# Patient Record
Sex: Female | Born: 1945 | Race: White | Hispanic: No | Marital: Married | Smoking: Never smoker
Health system: Southern US, Community
[De-identification: ages and names within clinical notes are randomized; demographics above are authoritative.]

## PROBLEM LIST (undated history)

## (undated) DIAGNOSIS — Z972 Presence of dental prosthetic device (complete) (partial): Secondary | ICD-10-CM

## (undated) DIAGNOSIS — K219 Gastro-esophageal reflux disease without esophagitis: Secondary | ICD-10-CM

## (undated) DIAGNOSIS — G2581 Restless legs syndrome: Secondary | ICD-10-CM

## (undated) HISTORY — PX: EYE SURGERY: SHX253

---

## 1984-02-10 HISTORY — PX: ABDOMINAL HYSTERECTOMY: SHX81

## 2004-03-12 ENCOUNTER — Inpatient Hospital Stay: Payer: Self-pay | Admitting: Internal Medicine

## 2004-03-13 ENCOUNTER — Other Ambulatory Visit: Payer: Self-pay

## 2004-07-09 ENCOUNTER — Other Ambulatory Visit: Payer: Self-pay

## 2004-07-09 ENCOUNTER — Inpatient Hospital Stay: Payer: Self-pay | Admitting: Internal Medicine

## 2005-07-02 ENCOUNTER — Ambulatory Visit: Payer: Self-pay | Admitting: Internal Medicine

## 2006-03-11 ENCOUNTER — Ambulatory Visit: Payer: Self-pay | Admitting: Internal Medicine

## 2006-04-29 ENCOUNTER — Ambulatory Visit: Payer: Self-pay | Admitting: Gastroenterology

## 2007-02-10 HISTORY — PX: ROTATOR CUFF REPAIR: SHX139

## 2007-03-24 ENCOUNTER — Ambulatory Visit: Payer: Self-pay | Admitting: Nurse Practitioner

## 2007-12-28 ENCOUNTER — Ambulatory Visit: Payer: Self-pay | Admitting: Unknown Physician Specialty

## 2008-01-03 ENCOUNTER — Ambulatory Visit: Payer: Self-pay | Admitting: Unknown Physician Specialty

## 2008-10-25 ENCOUNTER — Ambulatory Visit: Payer: Self-pay | Admitting: Nurse Practitioner

## 2010-01-06 ENCOUNTER — Ambulatory Visit: Payer: Self-pay | Admitting: Family Medicine

## 2011-08-25 ENCOUNTER — Ambulatory Visit: Payer: Self-pay

## 2013-01-19 ENCOUNTER — Ambulatory Visit: Payer: Self-pay

## 2013-02-09 HISTORY — PX: RETINAL DETACHMENT SURGERY: SHX105

## 2013-04-24 ENCOUNTER — Ambulatory Visit: Payer: Self-pay | Admitting: Ophthalmology

## 2013-05-03 ENCOUNTER — Ambulatory Visit: Payer: Self-pay | Admitting: Ophthalmology

## 2014-02-21 ENCOUNTER — Ambulatory Visit: Payer: Self-pay

## 2014-06-02 NOTE — Op Note (Signed)
PATIENT NAME:  Alexandra Farmer, Alexandra Farmer MR#:  [id number] DATE OF BIRTH:  01-01-1946  DATE OF PROCEDURE:  05/03/2013  PROCEDURES PERFORMED:  1. Pars plana vitrectomy of the right eye.  2. Internal limiting membrane peel of the right eye.   PREOPERATIVE DIAGNOSIS: Macular hole.   POSTOPERATIVE DIAGNOSIS: Macular hole.   ANESTHESIA: Retrobulbar block of the right eye with monitored anesthesia care.   COMPLICATIONS: None.   ESTIMATED BLOOD LOSS: Less than 1 mL.  PRIMARY SURGEON: Ignacia FellingMatthew F. Zerina Hallinan, MD  INDICATIONS FOR PROCEDURE: The patient presented to my office with decreased vision in her right eye. 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 Risks, benefits and alternatives of the above procedure were discussed, and the patient wished to proceed.   DETAILS OF PROCEDURE: After informed consent was obtained, the patient was brought into the operative suite at [facility]. The patient was placed in supine position, was given a small dose of Alfenta, and a retrobulbar block was performed in the right eye by the primary surgeon without any complications. The right eye was prepped and draped in sterile manner. After lid speculum was inserted, a 25-gauge trocar was placed inferotemporally through displaced conjunctiva in an oblique fashion 4 mm beyond the limbus. Infusion cannula was turned on and inserted through the trocar and secured in position with Steri-Strips. Two more trocars were placed in a similar fashion superotemporally and superonasally. Vitreous cutter and light pipe were introduced in the eye, and a core vitrectomy was performed. The vitreous face was elevated using suction without complication. The vitreous base was trimmed away. The peripheral vitreous was trimmed for 360 degrees, with care taken to avoid hitting the crystalline lens. Indocyanine green was injected onto the posterior pole and removed within 30 seconds. An internal limiting membrane  peel was performed for 360 degrees around the fovea for a total diameter of 2 disk diameters without complication. Scleral depressed exam was performed for 360 degrees, and no signs of any breaks, tears or retinal detachment could be identified. A complete air-fluid exchange was performed. Four minutes was allowed to elapse for dehydration of the vitreous base. This remnant fluid was removed. The 22% SF6 was used as an Systems developerair-gas exchange, and all the trocars were removed. The superonasal wound was closed using transconjunctival 6-0 plain gut. Pressure in the eye was confirmed to be approximately 15 mmHg. Dexamethasone 5 mg was given into the inferior fornix. The lid speculum was removed, and the eye was cleaned. TobraDex was placed in the eye, and a patch and shield were placed on the eye. The patient was taken to postanesthesia care with instruction to remain face down.   ____________________________ Ignacia FellingMatthew F. Champ MungoAppenzeller, MD mfa:lb D: 05/03/2013 09:23:49 ET T: 05/03/2013 09:36:21 ET JOB#: [id number]  cc: Ignacia FellingMatthew F. Champ MungoAppenzeller, MD, <Dictator> Cline CoolsMATTHEW F Joanthony Hamza MD ELECTRONICALLY SIGNED 05/17/2013 8:53

## 2014-12-05 ENCOUNTER — Encounter: Payer: Self-pay | Admitting: *Deleted

## 2014-12-06 NOTE — Discharge Instructions (Signed)

## 2014-12-10 ENCOUNTER — Ambulatory Visit
Admission: RE | Admit: 2014-12-10 | Discharge: 2014-12-10 | Disposition: A | Discharge status: Home / Self Care | Payer: Medicare PPO | Source: Ambulatory Visit | Attending: Ophthalmology | Admitting: Ophthalmology

## 2014-12-10 ENCOUNTER — Ambulatory Visit: Payer: Medicare PPO | Admitting: Anesthesiology

## 2014-12-10 ENCOUNTER — Encounter
Admission: RE | Disposition: A | Discharge status: Home / Self Care | Payer: Self-pay | Source: Ambulatory Visit | Attending: Ophthalmology

## 2014-12-10 DIAGNOSIS — Z882 Allergy status to sulfonamides status: Secondary | ICD-10-CM | POA: Insufficient documentation

## 2014-12-10 DIAGNOSIS — H2511 Age-related nuclear cataract, right eye: Secondary | ICD-10-CM | POA: Diagnosis present

## 2014-12-10 DIAGNOSIS — R32 Unspecified urinary incontinence: Secondary | ICD-10-CM | POA: Insufficient documentation

## 2014-12-10 DIAGNOSIS — Z90710 Acquired absence of both cervix and uterus: Secondary | ICD-10-CM | POA: Insufficient documentation

## 2014-12-10 DIAGNOSIS — K219 Gastro-esophageal reflux disease without esophagitis: Secondary | ICD-10-CM | POA: Insufficient documentation

## 2014-12-10 DIAGNOSIS — G2581 Restless legs syndrome: Secondary | ICD-10-CM | POA: Diagnosis not present

## 2014-12-10 HISTORY — PX: CATARACT EXTRACTION W/PHACO: SHX586

## 2014-12-10 HISTORY — DX: Gastro-esophageal reflux disease without esophagitis: K21.9

## 2014-12-10 HISTORY — DX: Restless legs syndrome: G25.81

## 2014-12-10 HISTORY — DX: Presence of dental prosthetic device (complete) (partial): Z97.2

## 2014-12-10 SURGERY — PHACOEMULSIFICATION, CATARACT, WITH IOL INSERTION
Anesthesia: Monitor Anesthesia Care | Site: Eye | Laterality: Right | Wound class: Clean

## 2014-12-10 MED ORDER — LACTATED RINGERS IV SOLN: INTRAVENOUS | Status: DC

## 2014-12-10 MED ORDER — LIDOCAINE HCL (PF) 4 % IJ SOLN
INTRAOCULAR | Status: DC | PRN
  Administered 2014-12-10: 1 mL via OPHTHALMIC

## 2014-12-10 MED ORDER — TIMOLOL MALEATE 0.5 % OP SOLN
OPHTHALMIC | Status: DC | PRN
  Administered 2014-12-10: 1 [drp] via OPHTHALMIC

## 2014-12-10 MED ORDER — FENTANYL CITRATE (PF) 100 MCG/2ML IJ SOLN
INTRAMUSCULAR | Status: DC | PRN
  Administered 2014-12-10: 50 ug via INTRAVENOUS

## 2014-12-10 MED ORDER — MIDAZOLAM HCL 2 MG/2ML IJ SOLN
INTRAMUSCULAR | Status: DC | PRN
  Administered 2014-12-10: 2 mg via INTRAVENOUS

## 2014-12-10 MED ORDER — BRIMONIDINE TARTRATE 0.2 % OP SOLN
OPHTHALMIC | Status: DC | PRN
  Administered 2014-12-10: 1 [drp] via OPHTHALMIC

## 2014-12-10 MED ORDER — POVIDONE-IODINE 5 % OP SOLN
1.0000 "application " | OPHTHALMIC | Status: DC | PRN
  Administered 2014-12-10: 1 via OPHTHALMIC

## 2014-12-10 MED ORDER — NA HYALUR & NA CHOND-NA HYALUR 0.4-0.35 ML IO KIT
PACK | INTRAOCULAR | Status: DC | PRN
  Administered 2014-12-10: 1 mL via INTRAOCULAR

## 2014-12-10 MED ORDER — TETRACAINE HCL 0.5 % OP SOLN
1.0000 [drp] | OPHTHALMIC | Status: DC | PRN
  Administered 2014-12-10: 1 [drp] via OPHTHALMIC

## 2014-12-10 MED ORDER — EPINEPHRINE HCL 1 MG/ML IJ SOLN
INTRAOCULAR | Status: DC | PRN
  Administered 2014-12-10: 94 mL via OPHTHALMIC

## 2014-12-10 MED ORDER — CEFUROXIME OPHTHALMIC INJECTION 1 MG/0.1 ML
INJECTION | OPHTHALMIC | Status: DC | PRN
  Administered 2014-12-10: 0.1 mL via INTRACAMERAL

## 2014-12-10 MED ORDER — ARMC OPHTHALMIC DILATING GEL
1.0000 "application " | OPHTHALMIC | Status: DC | PRN
  Administered 2014-12-10 (×2): 1 via OPHTHALMIC

## 2014-12-10 SURGICAL SUPPLY — 29 items
APPLICATOR COTTON TIP 3IN (MISCELLANEOUS) ×3 IMPLANT
CANNULA ANT/CHMB 27GA (MISCELLANEOUS) ×3 IMPLANT
DISSECTOR HYDRO NUCLEUS 50X22 (MISCELLANEOUS) ×3 IMPLANT
GLOVE BIO SURGEON STRL SZ7 (GLOVE) ×3 IMPLANT
GLOVE SURG LX 6.5 MICRO (GLOVE) ×2
GLOVE SURG LX STRL 6.5 MICRO (GLOVE) ×1 IMPLANT
GOWN STRL REUS W/ TWL LRG LVL3 (GOWN DISPOSABLE) ×2 IMPLANT
GOWN STRL REUS W/TWL LRG LVL3 (GOWN DISPOSABLE) ×4
LENS IOL ACRSF IQ PC 20.5 (Intraocular Lens) ×1 IMPLANT
LENS IOL ACRYSOF IQ POST 20.5 (Intraocular Lens) ×3 IMPLANT
MARKER SKIN SURG W/RULER VIO (MISCELLANEOUS) ×3 IMPLANT
NEEDLE FILTER BLUNT 18X 1/2SAF (NEEDLE) ×4
NEEDLE FILTER BLUNT 18X1 1/2 (NEEDLE) ×2 IMPLANT
PACK CATARACT BRASINGTON (MISCELLANEOUS) ×3 IMPLANT
PACK EYE AFTER SURG (MISCELLANEOUS) ×3 IMPLANT
PACK OPTHALMIC (MISCELLANEOUS) ×3 IMPLANT
RING MALYGIN 7.0 (MISCELLANEOUS) IMPLANT
SOL BAL SALT 15ML (MISCELLANEOUS)
SOLUTION BAL SALT 15ML (MISCELLANEOUS) IMPLANT
SUT ETHILON 10-0 CS-B-6CS-B-6 (SUTURE)
SUT VICRYL  9 0 (SUTURE)
SUT VICRYL 9 0 (SUTURE) IMPLANT
SUTURE EHLN 10-0 CS-B-6CS-B-6 (SUTURE) IMPLANT
SYR 3ML LL SCALE MARK (SYRINGE) ×6 IMPLANT
SYR TB 1ML LUER SLIP (SYRINGE) ×3 IMPLANT
WATER STERILE IRR 250ML POUR (IV SOLUTION) ×3 IMPLANT
WATER STERILE IRR 500ML POUR (IV SOLUTION) IMPLANT
WICK EYE OCUCEL (MISCELLANEOUS) IMPLANT
WIPE NON LINTING 3.25X3.25 (MISCELLANEOUS) ×3 IMPLANT

## 2014-12-10 NOTE — Anesthesia Postprocedure Evaluation (Signed)
  Anesthesia Post-op Note  Patient: Alexandra Farmer  Procedure(s) Performed: Procedure(s): CATARACT EXTRACTION PHACO AND INTRAOCULAR LENS PLACEMENT (IOC) (Right)  Anesthesia type:MAC  Patient location: PACU  Post pain: Pain level controlled  Post assessment: Post-op Vital signs reviewed, Patient's Cardiovascular Status Stable, Respiratory Function Stable, Patent Airway and No signs of Nausea or vomiting  Post vital signs: Reviewed and stable  Last Vitals:  Filed Vitals:   12/10/14 1224  BP:   Pulse: 67  Temp: 36.5 C  Resp: 14    Level of consciousness: awake, alert  and patient cooperative  Complications: No apparent anesthesia complications

## 2014-12-10 NOTE — Anesthesia Preprocedure Evaluation (Signed)
Anesthesia Evaluation  Patient identified by MRN, date of birth, ID band Patient awake    Reviewed: Allergy & Precautions, H&P , NPO status , Patient's Chart, lab work & pertinent test results  Airway Mallampati: II  TM Distance: >3 FB Neck ROM: full    Dental no notable dental hx.    Pulmonary neg pulmonary ROS,    Pulmonary exam normal        Cardiovascular negative cardio ROS Normal cardiovascular exam     Neuro/Psych    GI/Hepatic Neg liver ROS, GERD  Medicated,  Endo/Other  negative endocrine ROS  Renal/GU negative Renal ROS     Musculoskeletal   Abdominal   Peds  Hematology negative hematology ROS (+)   Anesthesia Other Findings   Reproductive/Obstetrics                             Anesthesia Physical Anesthesia Plan  ASA: II  Anesthesia Plan: MAC   Post-op Pain Management:    Induction:   Airway Management Planned:   Additional Equipment:   Intra-op Plan:   Post-operative Plan:   Informed Consent: I have reviewed the patients History and Physical, chart, labs and discussed the procedure including the risks, benefits and alternatives for the proposed anesthesia with the patient or authorized representative who has indicated his/her understanding and acceptance.     Plan Discussed with: CRNA  Anesthesia Plan Comments:         Anesthesia Quick Evaluation

## 2014-12-10 NOTE — Transfer of Care (Signed)
Immediate Anesthesia Transfer of Care Note  Patient: Alexandra MaxwellMary S Weick  Procedure(s) Performed: Procedure(s): CATARACT EXTRACTION PHACO AND INTRAOCULAR LENS PLACEMENT (IOC) (Right)  Patient Location: PACU  Anesthesia Type: MAC  Level of Consciousness: awake, alert  and patient cooperative  Airway and Oxygen Therapy: Patient Spontanous Breathing and Patient connected to supplemental oxygen  Post-op Assessment: Post-op Vital signs reviewed, Patient's Cardiovascular Status Stable, Respiratory Function Stable, Patent Airway and No signs of Nausea or vomiting  Post-op Vital Signs: Reviewed and stable  Complications: No apparent anesthesia complications

## 2014-12-10 NOTE — H&P (Signed)
H+P reviewed and is up to date, please see paper chart.  

## 2014-12-10 NOTE — Op Note (Signed)
Date of Surgery: 12/10/2014  PREOPERATIVE DIAGNOSES: Visually significant nuclear sclerotic cataract, right eye.  POSTOPERATIVE DIAGNOSES: Same  PROCEDURES PERFORMED: Cataract extraction with intraocular lens implant, right eye.  SURGEON: Devin GoingAnita P. Vin, M.D.  ANESTHESIA: MAC and topical  IMPLANTS: AcrySof IQ [id number] +20.5   Implant Name Type Inv. Item Serial No. Manufacturer Lot No. LRB No. Used  IMPLANT LENS - [id number] Intraocular Lens IMPLANT LENS [id number] ALCON   Right 1     COMPLICATIONS: None.  DESCRIPTION OF PROCEDURE: Therapeutic options were discussed with the patient preoperatively, including a discussion of risks and benefits of surgery. Informed consent was obtained. An IOL-Master and immersion biometry were used to take the lens measurements, and a dilated fundus exam was performed within 6 months of the surgical date.  The patient was premedicated and brought to the operating room and placed on the operating table in the supine position. After adequate anesthesia, the patient was prepped and draped in the usual sterile ophthalmic fashion. A wire lid speculum was inserted and the microscope was positioned. A Superblade was used to create a paracentesis site at the limbus and a small amount of dilute preservative free lidocaine was instilled into the anterior chamber, followed by dispersive viscoelastic. A clear corneal incision was created temporally using a 2.4 mm keratome blade. Capsulorrhexis was then performed. In situ phacoemulsification was performed.  Cortical material was removed with the irrigation-aspiration unit. Dispersive viscoelastic was instilled to open the capsular bag. A posterior chamber intraocular lens with the specifications above was inserted and positioned. Irrigation-aspiration was used to remove all viscoelastic. Cefuroxime 1cc was instilled into the anterior chamber, and the corneal incision was checked and found to be water tight. The eyelid  speculum was removed.  The operative eye was covered with protective goggles after instilling 1 drop of timolol and brimonidine. The patient tolerated the procedure well. There were no complications.

## 2014-12-11 ENCOUNTER — Encounter: Payer: Self-pay | Admitting: Ophthalmology

## 2015-01-29 ENCOUNTER — Other Ambulatory Visit: Payer: Self-pay | Admitting: Nurse Practitioner

## 2015-01-29 DIAGNOSIS — Z1231 Encounter for screening mammogram for malignant neoplasm of breast: Secondary | ICD-10-CM

## 2015-02-25 ENCOUNTER — Encounter: Payer: Self-pay | Admitting: *Deleted

## 2015-02-27 ENCOUNTER — Ambulatory Visit
Admission: RE | Admit: 2015-02-27 | Discharge: 2015-02-27 | Disposition: A | Discharge status: Home / Self Care | Payer: Medicare Other | Source: Ambulatory Visit | Attending: Nurse Practitioner | Admitting: Nurse Practitioner

## 2015-02-27 DIAGNOSIS — Z1231 Encounter for screening mammogram for malignant neoplasm of breast: Secondary | ICD-10-CM | POA: Diagnosis present

## 2015-03-04 NOTE — Discharge Instructions (Signed)

## 2015-03-05 ENCOUNTER — Ambulatory Visit: Payer: Medicare Other | Admitting: Anesthesiology

## 2015-03-05 ENCOUNTER — Encounter
Admission: RE | Disposition: A | Discharge status: Home / Self Care | Payer: Self-pay | Source: Ambulatory Visit | Attending: Gastroenterology

## 2015-03-05 ENCOUNTER — Ambulatory Visit
Admission: RE | Admit: 2015-03-05 | Discharge: 2015-03-05 | Disposition: A | Discharge status: Home / Self Care | Payer: Medicare Other | Source: Ambulatory Visit | Attending: Gastroenterology | Admitting: Gastroenterology

## 2015-03-05 DIAGNOSIS — Z7982 Long term (current) use of aspirin: Secondary | ICD-10-CM | POA: Insufficient documentation

## 2015-03-05 DIAGNOSIS — R1310 Dysphagia, unspecified: Secondary | ICD-10-CM | POA: Insufficient documentation

## 2015-03-05 DIAGNOSIS — K219 Gastro-esophageal reflux disease without esophagitis: Secondary | ICD-10-CM | POA: Insufficient documentation

## 2015-03-05 DIAGNOSIS — E785 Hyperlipidemia, unspecified: Secondary | ICD-10-CM | POA: Diagnosis not present

## 2015-03-05 DIAGNOSIS — Z885 Allergy status to narcotic agent status: Secondary | ICD-10-CM | POA: Insufficient documentation

## 2015-03-05 DIAGNOSIS — Z90710 Acquired absence of both cervix and uterus: Secondary | ICD-10-CM | POA: Diagnosis not present

## 2015-03-05 DIAGNOSIS — K921 Melena: Secondary | ICD-10-CM | POA: Insufficient documentation

## 2015-03-05 DIAGNOSIS — K625 Hemorrhage of anus and rectum: Secondary | ICD-10-CM | POA: Diagnosis not present

## 2015-03-05 DIAGNOSIS — G2581 Restless legs syndrome: Secondary | ICD-10-CM | POA: Diagnosis not present

## 2015-03-05 DIAGNOSIS — E669 Obesity, unspecified: Secondary | ICD-10-CM | POA: Insufficient documentation

## 2015-03-05 DIAGNOSIS — Z79899 Other long term (current) drug therapy: Secondary | ICD-10-CM | POA: Diagnosis not present

## 2015-03-05 HISTORY — PX: ESOPHAGOGASTRODUODENOSCOPY: SHX5428

## 2015-03-05 HISTORY — PX: COLONOSCOPY: SHX5424

## 2015-03-05 SURGERY — COLONOSCOPY
Anesthesia: Monitor Anesthesia Care | Wound class: Contaminated

## 2015-03-05 MED ORDER — GLYCOPYRROLATE 0.2 MG/ML IJ SOLN
INTRAMUSCULAR | Status: DC | PRN
  Administered 2015-03-05: .2 mg via INTRAVENOUS

## 2015-03-05 MED ORDER — STERILE WATER FOR IRRIGATION IR SOLN
Status: DC | PRN
  Administered 2015-03-05: 10:00:00

## 2015-03-05 MED ORDER — LIDOCAINE HCL (CARDIAC) 20 MG/ML IV SOLN
INTRAVENOUS | Status: DC | PRN
  Administered 2015-03-05: 40 mg via INTRAVENOUS

## 2015-03-05 MED ORDER — FENTANYL CITRATE (PF) 100 MCG/2ML IJ SOLN: 25.0000 ug | INTRAMUSCULAR | Status: DC | PRN

## 2015-03-05 MED ORDER — SODIUM CHLORIDE 0.9 % IV SOLN: INTRAVENOUS | Status: DC

## 2015-03-05 MED ORDER — LACTATED RINGERS IV SOLN: 500.0000 mL | INTRAVENOUS | Status: DC

## 2015-03-05 MED ORDER — DEXAMETHASONE SODIUM PHOSPHATE 4 MG/ML IJ SOLN: 8.0000 mg | Freq: Once | INTRAMUSCULAR | Status: DC | PRN

## 2015-03-05 MED ORDER — ACETAMINOPHEN 160 MG/5ML PO SOLN: 325.0000 mg | ORAL | Status: DC | PRN

## 2015-03-05 MED ORDER — PROPOFOL 10 MG/ML IV BOLUS
INTRAVENOUS | Status: DC | PRN
  Administered 2015-03-05 (×7): 50 mg via INTRAVENOUS

## 2015-03-05 MED ORDER — LACTATED RINGERS IV SOLN
INTRAVENOUS | Status: DC
  Administered 2015-03-05: 10:00:00 via INTRAVENOUS

## 2015-03-05 MED ORDER — ACETAMINOPHEN 325 MG PO TABS: 325.0000 mg | ORAL_TABLET | ORAL | Status: DC | PRN

## 2015-03-05 SURGICAL SUPPLY — 41 items
BALLN DILATOR 10-12 8 (BALLOONS)
BALLN DILATOR 12-15 8 (BALLOONS)
BALLN DILATOR 15-18 8 (BALLOONS)
BALLN DILATOR CRE 0-12 8 (BALLOONS)
BALLN DILATOR ESOPH 8 10 CRE (MISCELLANEOUS) IMPLANT
BALLOON DILATOR 12-15 8 (BALLOONS) IMPLANT
BALLOON DILATOR 15-18 8 (BALLOONS) IMPLANT
BALLOON DILATOR CRE 0-12 8 (BALLOONS) IMPLANT
BLOCK BITE 60FR ADLT L/F GRN (MISCELLANEOUS) ×3 IMPLANT
CANISTER SUCT 1200ML W/VALVE (MISCELLANEOUS) ×3 IMPLANT
FCP ESCP3.2XJMB 240X2.8X (MISCELLANEOUS)
FORCEPS BIOP RAD 4 LRG CAP 4 (CUTTING FORCEPS) ×3 IMPLANT
FORCEPS BIOP RJ4 240 W/NDL (MISCELLANEOUS)
FORCEPS ESCP3.2XJMB 240X2.8X (MISCELLANEOUS) IMPLANT
GOWN CVR UNV OPN BCK APRN NK (MISCELLANEOUS) ×1 IMPLANT
GOWN ISOL THUMB LOOP REG UNIV (MISCELLANEOUS) ×2
GOWN STRL REUS W/ TWL LRG LVL3 (GOWN DISPOSABLE) ×1 IMPLANT
GOWN STRL REUS W/TWL LRG LVL3 (GOWN DISPOSABLE) ×2
HEMOCLIP INSTINCT (CLIP) IMPLANT
INJECTOR VARIJECT VIN23 (MISCELLANEOUS) IMPLANT
KIT CO2 TUBING (TUBING) IMPLANT
KIT DEFENDO VALVE AND CONN (KITS) IMPLANT
KIT ENDO PROCEDURE OLY (KITS) ×3 IMPLANT
LIGATOR MULTIBAND 6SHOOTER MBL (MISCELLANEOUS) IMPLANT
MARKER SPOT ENDO TATTOO 5ML (MISCELLANEOUS) IMPLANT
PAD GROUND ADULT SPLIT (MISCELLANEOUS) IMPLANT
SNARE SHORT THROW 13M SML OVAL (MISCELLANEOUS) IMPLANT
SNARE SHORT THROW 30M LRG OVAL (MISCELLANEOUS) IMPLANT
SPOT EX ENDOSCOPIC TATTOO (MISCELLANEOUS)
SUCTION POLY TRAP 4CHAMBER (MISCELLANEOUS) IMPLANT
SYR INFLATION 60ML (SYRINGE) IMPLANT
TRAP SUCTION POLY (MISCELLANEOUS) IMPLANT
TUBING CONN 6MMX3.1M (TUBING)
TUBING SUCTION CONN 0.25 STRL (TUBING) IMPLANT
UNDERPAD 30X60 958B10 (PK) (MISCELLANEOUS) IMPLANT
VALVE BIOPSY ENDO (VALVE) IMPLANT
VARIJECT INJECTOR VIN23 (MISCELLANEOUS)
WATER AUXILLARY (MISCELLANEOUS) IMPLANT
WATER STERILE IRR 250ML POUR (IV SOLUTION) ×3 IMPLANT
WATER STERILE IRR 500ML POUR (IV SOLUTION) IMPLANT
WIRE CRE 18-20MM 8CM F G (MISCELLANEOUS) IMPLANT

## 2015-03-05 NOTE — Transfer of Care (Signed)
Immediate Anesthesia Transfer of Care Note  Patient: Alexandra Farmer  Procedure(s) Performed: Procedure(s): COLONOSCOPY (N/A) ESOPHAGOGASTRODUODENOSCOPY (EGD) (N/A)  Patient Location: PACU  Anesthesia Type: MAC  Level of Consciousness: awake, alert  and patient cooperative  Airway and Oxygen Therapy: Patient Spontanous Breathing and Patient connected to supplemental oxygen  Post-op Assessment: Post-op Vital signs reviewed, Patient's Cardiovascular Status Stable, Respiratory Function Stable, Patent Airway and No signs of Nausea or vomiting  Post-op Vital Signs: Reviewed and stable  Complications: No apparent anesthesia complications

## 2015-03-05 NOTE — H&P (Signed)
  Date of Initial H&P:02/07/2015  History reviewed, patient examined, no change in status, stable for surgery. 

## 2015-03-05 NOTE — Op Note (Signed)
[facility] Gastroenterology Patient Name: Alexandra Farmer Procedure Date: 03/05/2015 10:30 AM MRN: [id number] Account #: [id number] Date of Birth: 09/26/1945 Admit Type: Outpatient Age: 70 Room: [facility] OR ROOM 01 Gender: Female Note Status: Finalized Procedure:         Colonoscopy Indications:       Heme positive stool, Rectal bleeding Providers:         Ezzard Standing. Bluford Kaufmann, MD Medicines:         Monitored Anesthesia Care Complications:     No immediate complications. Procedure:         Pre-Anesthesia Assessment:                    - Prior to the procedure, a History and Physical was                     performed, and patient medications, allergies and                     sensitivities were reviewed. The patient's tolerance of                     previous anesthesia was reviewed.                    - The risks and benefits of the procedure and the sedation                     options and risks were discussed with the patient. All                     questions were answered and informed consent was obtained.                    - After reviewing the risks and benefits, the patient was                     deemed in satisfactory condition to undergo the procedure.                    After obtaining informed consent, the colonoscope was                     passed under direct vision. Throughout the procedure, the                     patient's blood pressure, pulse, and oxygen saturations                     were monitored continuously. The Olympus CF-HQ190L                     Colonoscope (S#. [id number]) was introduced through the anus                     and advanced to the the cecum, identified by appendiceal                     orifice and ileocecal valve. The colonoscopy was performed                     without difficulty. The patient tolerated the procedure                     well. The quality of the bowel preparation  was good. Findings:      The colon (entire examined portion)  appeared normal. Impression:        - The entire examined colon is normal.                    - No specimens collected. Recommendation:    - Discharge patient to home.                    - The findings and recommendations were discussed with the                     patient's family. Procedure Code(s): --- Professional ---                    [phone], Colonoscopy, flexible; diagnostic, including                     collection of specimen(s) by brushing or washing, when                     performed (separate procedure) Diagnosis Code(s): --- Professional ---                    R19.5, Other fecal abnormalities                    K62.5, Hemorrhage of anus and rectum CPT copyright 2014 American Medical Association. All rights reserved. The codes documented in this report are preliminary and upon coder review may  be revised to meet current compliance requirements. Wallace Cullens, MD 03/05/2015 10:45:35 AM This report has been signed electronically. Number of Addenda: 0 Note Initiated On: 03/05/2015 10:30 AM      [facility]

## 2015-03-05 NOTE — Anesthesia Procedure Notes (Signed)
Procedure Name: MAC Performed by: Camran Keady Pre-anesthesia Checklist: Patient identified, Emergency Drugs available, Suction available, Timeout performed and Patient being monitored Patient Re-evaluated:Patient Re-evaluated prior to inductionOxygen Delivery Method: Nasal cannula Placement Confirmation: positive ETCO2     

## 2015-03-05 NOTE — Anesthesia Preprocedure Evaluation (Signed)
Anesthesia Evaluation  Patient identified by MRN, date of birth, ID band Patient awake    Reviewed: Allergy & Precautions, H&P , NPO status , Patient's Chart, lab work & pertinent test results, reviewed documented beta blocker date and time   Airway Mallampati: II  TM Distance: >3 FB Neck ROM: full    Dental  (+) Upper Dentures, Lower Dentures   Pulmonary neg pulmonary ROS,    Pulmonary exam normal breath sounds clear to auscultation       Cardiovascular Exercise Tolerance: Good negative cardio ROS   Rhythm:regular Rate:Normal     Neuro/Psych negative neurological ROS  negative psych ROS   GI/Hepatic Neg liver ROS, GERD  Medicated,  Endo/Other  negative endocrine ROS  Renal/GU negative Renal ROS  negative genitourinary   Musculoskeletal   Abdominal   Peds  Hematology negative hematology ROS (+)   Anesthesia Other Findings   Reproductive/Obstetrics negative OB ROS                             Anesthesia Physical Anesthesia Plan  ASA: II  Anesthesia Plan: MAC   Post-op Pain Management:    Induction:   Airway Management Planned:   Additional Equipment:   Intra-op Plan:   Post-operative Plan:   Informed Consent: I have reviewed the patients History and Physical, chart, labs and discussed the procedure including the risks, benefits and alternatives for the proposed anesthesia with the patient or authorized representative who has indicated his/her understanding and acceptance.     Plan Discussed with: CRNA  Anesthesia Plan Comments:         Anesthesia Quick Evaluation

## 2015-03-05 NOTE — Anesthesia Postprocedure Evaluation (Signed)
Anesthesia Post Note  Patient: Alexandra Farmer  Procedure(s) Performed: Procedure(s) (LRB): COLONOSCOPY (N/A) ESOPHAGOGASTRODUODENOSCOPY (EGD) (N/A)  Patient location during evaluation: PACU Anesthesia Type: MAC Level of consciousness: awake and alert Pain management: pain level controlled Vital Signs Assessment: post-procedure vital signs reviewed and stable Respiratory status: spontaneous breathing, nonlabored ventilation and respiratory function stable Cardiovascular status: blood pressure returned to baseline and stable Postop Assessment: no signs of nausea or vomiting Anesthetic complications: no    DANIEL D KOVACS

## 2015-03-05 NOTE — Op Note (Signed)
[facility] Gastroenterology Patient Name: Alexandra Farmer Procedure Date: 03/05/2015 10:20 AM MRN: [id number] Account #: [id number] Date of Birth: 10-Oct-1945 Admit Type: Outpatient Age: 70 Room: [facility] OR ROOM 01 Gender: Female Note Status: Finalized Procedure:         Upper GI endoscopy Indications:       Dysphagia, Suspected esophageal reflux Providers:         Ezzard Standing. Bluford Kaufmann, MD Medicines:         Monitored Anesthesia Care Complications:     No immediate complications. Procedure:         Pre-Anesthesia Assessment:                    - Prior to the procedure, a History and Physical was                     performed, and patient medications, allergies and                     sensitivities were reviewed. The patient's tolerance of                     previous anesthesia was reviewed.                    - The risks and benefits of the procedure and the sedation                     options and risks were discussed with the patient. All                     questions were answered and informed consent was obtained.                    - After reviewing the risks and benefits, the patient was                     deemed in satisfactory condition to undergo the procedure.                    After obtaining informed consent, the endoscope was passed                     under direct vision. Throughout the procedure, the                     patient's blood pressure, pulse, and oxygen saturations                     were monitored continuously. The was introduced through                     the mouth, and advanced to the second part of duodenum.                     The upper GI endoscopy was accomplished without                     difficulty. The patient tolerated the procedure well. Findings:      No endoscopic abnormality was evident in the esophagus to explain the       patient's complaint of dysphagia. It was decided, however, to proceed       with dilation of the entire  esophagus. The scope was withdrawn. Dilation  was performed with a Maloney dilator with mild resistance at 54 Fr.       Biopsies were taken with a cold forceps for histology.      The entire examined stomach was normal.      The examined duodenum was normal. Impression:        - No endoscopic esophageal abnormality to explain                     patient's dysphagia. Esophagus dilated. Dilated. Biopsied.                    - Normal stomach.                    - Normal examined duodenum. Recommendation:    - Discharge patient to home.                    - Observe patient's clinical course.                    - The findings and recommendations were discussed with the                     patient.                    - Await pathology results. Procedure Code(s): --- Professional ---                    [phone], Esophagogastroduodenoscopy, flexible, transoral;                     with biopsy, single or multiple                    43450, Dilation of esophagus, by unguided sound or bougie,                     single or multiple passes Diagnosis Code(s): --- Professional ---                    R13.10, Dysphagia, unspecified CPT copyright 2014 American Medical Association. All rights reserved. The codes documented in this report are preliminary and upon coder review may  be revised to meet current compliance requirements. Wallace Cullens, MD 03/05/2015 10:30:16 AM This report has been signed electronically. Number of Addenda: 0 Note Initiated On: 03/05/2015 10:20 AM Total Procedure Duration: 0 hours 3 minutes 36 seconds       [facility]

## 2015-03-06 ENCOUNTER — Encounter: Payer: Self-pay | Admitting: Gastroenterology

## 2015-03-07 LAB — SURGICAL PATHOLOGY

## 2015-10-02 ENCOUNTER — Ambulatory Visit
Admission: EM | Admit: 2015-10-02 | Discharge: 2015-10-02 | Disposition: A | Discharge status: Home / Self Care | Payer: Medicare Other | Attending: Family Medicine | Admitting: Family Medicine

## 2015-10-02 DIAGNOSIS — M67432 Ganglion, left wrist: Secondary | ICD-10-CM

## 2015-10-02 NOTE — Discharge Instructions (Signed)
Follow-up with orthopedics if any problems from the ganglion cyst. May need to have ganglion cyst aspirated or surgically removed.

## 2015-10-02 NOTE — ED Provider Notes (Signed)
[facility]    CSN: [id number] Arrival date & time: 10/02/15  [id number]  First Provider Contact:  None       History   Chief Complaint Chief Complaint  Patient presents with  . Cyst    HPI Alexandra Farmer is a 70 y.o. female.   HPI : Patient presents today with symptoms of left wrist mass. Patient states that she noticed a mass along the dorsal surface of the left wrist a few hours ago. Patient states that she was raking her lawn earlier today. She denies any other trauma to the wrist recently. She denies any significant pain of the area.  Past Medical History:  Diagnosis Date  . GERD (gastroesophageal reflux disease)   . Restless leg syndrome   . Wears dentures    full upper and lower    There are no active problems to display for this patient.   Past Surgical History:  Procedure Laterality Date  . ABDOMINAL HYSTERECTOMY  1986  . CATARACT EXTRACTION W/PHACO Right 12/10/2014   Procedure: CATARACT EXTRACTION PHACO AND INTRAOCULAR LENS PLACEMENT (IOC);  Surgeon: Sherald HessAnita Prakash Vin-Parikh, MD;  [facility];  Service: Ophthalmology;  Laterality: Right;  . COLONOSCOPY N/A 03/05/2015   Procedure: COLONOSCOPY;  Surgeon: Wallace CullensPaul Y Oh, MD;  [facility];  Service: Gastroenterology;  Laterality: N/A;  . ESOPHAGOGASTRODUODENOSCOPY N/A 03/05/2015   Procedure: ESOPHAGOGASTRODUODENOSCOPY (EGD);  Surgeon: Wallace CullensPaul Y Oh, MD;  [facility];  Service: Gastroenterology;  Laterality: N/A;  . RETINAL DETACHMENT SURGERY Right 2015  . ROTATOR CUFF REPAIR  2009    OB History    No data available       Home Medications    Prior to Admission medications   Medication Sig Start Date End Date Taking? Authorizing Provider  aspirin 81 MG tablet Take 81 mg by mouth daily.    Historical Provider, MD  Omega-3 Fatty Acids (FISH OIL PO) Take by mouth.    Historical Provider, MD  pantoprazole (PROTONIX) 20 MG tablet Take 20 mg by mouth daily.     Historical Provider, MD  POLYETHYLENE GLYCOL 3350 PO Take 1 packet by mouth daily.    Historical Provider, MD  Probiotic Product (PROBIOTIC PO) Take by mouth.    Historical Provider, MD    Family History History reviewed. No pertinent family history.  Social History Social History  Substance Use Topics  . Smoking status: Never Smoker  . Smokeless tobacco: Never Used  . Alcohol use No     Allergies   Oxycodone and Sulfa antibiotics   Review of Systems Review of Systems: Negative except mentioned above.   Physical Exam Triage Vital Signs ED Triage Vitals  Enc Vitals Group     BP 10/02/15 1919 128/77     Pulse Rate 10/02/15 1919 67     Resp 10/02/15 1919 16     Temp 10/02/15 1919 97.4 F (36.3 C)     Temp Source 10/02/15 1919 Tympanic     SpO2 10/02/15 1919 97 %     Weight 10/02/15 1918 169 lb (76.7 kg)     Height 10/02/15 1918 5\' 3"  (1.6 m)     Head Circumference --      Peak Flow --      Pain Score --      Pain Loc --      Pain Edu? --      Excl. in GC? --    No data found.   Updated  Vital Signs BP 128/77 (BP Location: Left Arm)   Pulse 67   Temp 97.4 F (36.3 C) (Tympanic)   Resp 16   Ht 5\' 3"  (1.6 m)   Wt 169 lb (76.7 kg)   SpO2 97%   BMI 29.94 kg/m      Physical Exam:  GENERAL: NAD RESP: CTA B CARD: RRR MSK: dime sized fluctuant minimally tender mass along dorsal surface of wrist, FROM, nv intact    UC Treatments / Results  Labs (all labs ordered are listed, but only abnormal results are displayed) Labs Reviewed - No data to display  EKG  EKG Interpretation None       Radiology No results found.  Procedures Procedures (including critical care time)  Medications Ordered in UC Medications - No data to display   Initial Impression / Assessment and Plan / UC Course  I have reviewed the triage vital signs and the nursing notes.  Pertinent labs & imaging results that were available during my care of the patient were reviewed by  me and considered in my medical decision making (see chart for details).  Clinical Course   A/P: L Wrist Ganglion Cyst - Tylenol/NSAIDs when necessary pain, follow-up with orthopedics if patient has any problems from the cyst and wants it aspirated, patient addresses understanding and will follow up with orthopedics if needed.   Final Clinical Impressions(s) / UC Diagnoses   Final diagnoses:  Ganglion cyst of wrist, left    New Prescriptions New Prescriptions   No medications on file     Jolene ProvostKirtida Margurette Brener, MD 10/02/15 1944

## 2015-10-02 NOTE — ED Triage Notes (Signed)
Patient complains of cyst on left wrist. Patient states that she noticed the cyst come up in the last 2 hours with no pain and no known injury. Patient states that she thinks this may be a boil.

## 2016-01-30 ENCOUNTER — Other Ambulatory Visit: Payer: Self-pay | Admitting: Nurse Practitioner

## 2016-01-30 DIAGNOSIS — Z1231 Encounter for screening mammogram for malignant neoplasm of breast: Secondary | ICD-10-CM

## 2016-03-02 ENCOUNTER — Ambulatory Visit
Admission: RE | Admit: 2016-03-02 | Discharge: 2016-03-02 | Disposition: A | Discharge status: Home / Self Care | Payer: Medicare Other | Source: Ambulatory Visit | Attending: Nurse Practitioner | Admitting: Nurse Practitioner

## 2016-03-02 DIAGNOSIS — Z1231 Encounter for screening mammogram for malignant neoplasm of breast: Secondary | ICD-10-CM | POA: Diagnosis present

## 2016-03-05 ENCOUNTER — Other Ambulatory Visit: Payer: Self-pay | Admitting: Nurse Practitioner

## 2016-03-05 DIAGNOSIS — R928 Other abnormal and inconclusive findings on diagnostic imaging of breast: Secondary | ICD-10-CM

## 2016-03-05 DIAGNOSIS — R921 Mammographic calcification found on diagnostic imaging of breast: Secondary | ICD-10-CM

## 2016-03-05 DIAGNOSIS — N6489 Other specified disorders of breast: Secondary | ICD-10-CM

## 2016-03-17 ENCOUNTER — Ambulatory Visit
Admission: RE | Admit: 2016-03-17 | Discharge: 2016-03-17 | Disposition: A | Discharge status: Home / Self Care | Payer: Medicare Other | Source: Ambulatory Visit | Attending: Nurse Practitioner | Admitting: Nurse Practitioner

## 2016-03-17 DIAGNOSIS — R921 Mammographic calcification found on diagnostic imaging of breast: Secondary | ICD-10-CM | POA: Diagnosis not present

## 2016-03-17 DIAGNOSIS — N6489 Other specified disorders of breast: Secondary | ICD-10-CM

## 2016-03-17 DIAGNOSIS — R928 Other abnormal and inconclusive findings on diagnostic imaging of breast: Secondary | ICD-10-CM

## 2017-01-28 ENCOUNTER — Other Ambulatory Visit: Payer: Self-pay | Admitting: Nurse Practitioner

## 2017-01-28 DIAGNOSIS — Z1231 Encounter for screening mammogram for malignant neoplasm of breast: Secondary | ICD-10-CM

## 2017-01-28 DIAGNOSIS — Z87898 Personal history of other specified conditions: Secondary | ICD-10-CM

## 2017-03-09 ENCOUNTER — Ambulatory Visit
Admission: RE | Admit: 2017-03-09 | Discharge: 2017-03-09 | Disposition: A | Discharge status: Home / Self Care | Payer: Medicare Other | Source: Ambulatory Visit | Attending: Nurse Practitioner | Admitting: Nurse Practitioner

## 2017-03-09 DIAGNOSIS — R921 Mammographic calcification found on diagnostic imaging of breast: Secondary | ICD-10-CM | POA: Diagnosis not present

## 2017-03-09 DIAGNOSIS — Z87898 Personal history of other specified conditions: Secondary | ICD-10-CM

## 2017-03-09 DIAGNOSIS — Z1231 Encounter for screening mammogram for malignant neoplasm of breast: Secondary | ICD-10-CM | POA: Diagnosis not present

## 2018-02-08 ENCOUNTER — Other Ambulatory Visit: Payer: Self-pay | Admitting: Nurse Practitioner

## 2018-02-08 DIAGNOSIS — Z1231 Encounter for screening mammogram for malignant neoplasm of breast: Secondary | ICD-10-CM

## 2018-03-12 IMAGING — MG MM DIGITAL DIAGNOSTIC UNILAT*L* W/ TOMO W/ CAD
8 of 9 series · 8 of 17 positions shown · non-contrast
Comparison: Previous exam(s).

CLINICAL DATA: Patient returns today to evaluate a possible
asymmetry with associated calcifications in the left breast.

EXAM:
2D DIGITAL DIAGNOSTIC UNILATERAL LEFT MAMMOGRAM WITH CAD AND ADJUNCT
TOMO

[L ML (1 of 2)]
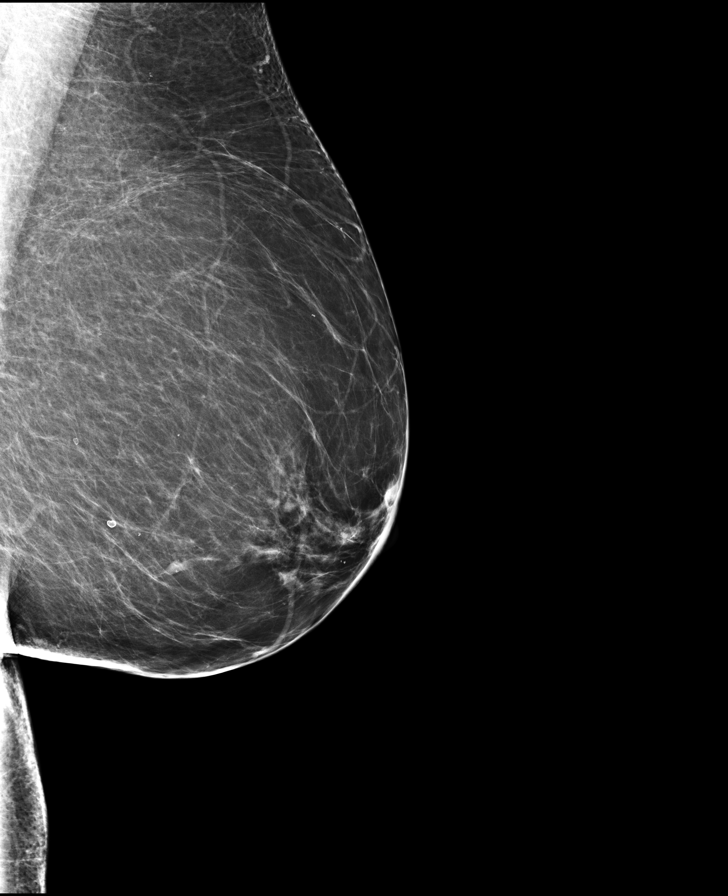

[L ML (2 of 2)]
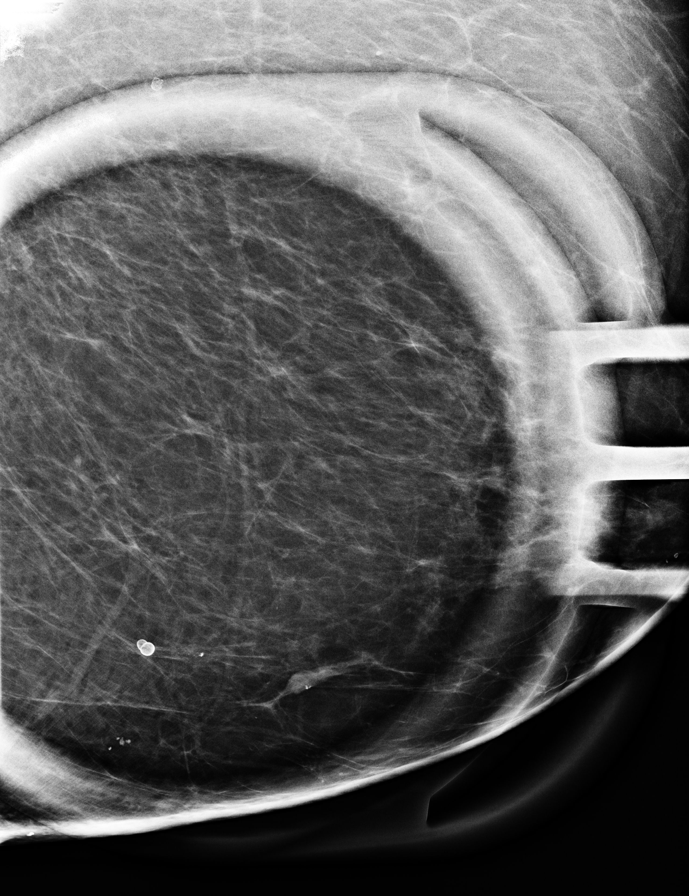

[L CC (1 of 2)]
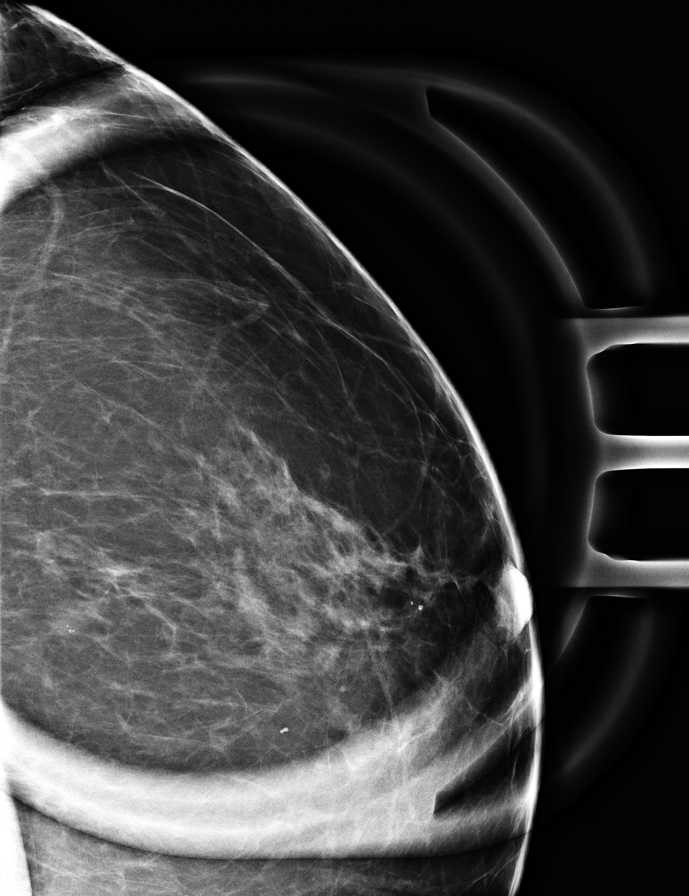

[L CC (2 of 2)]
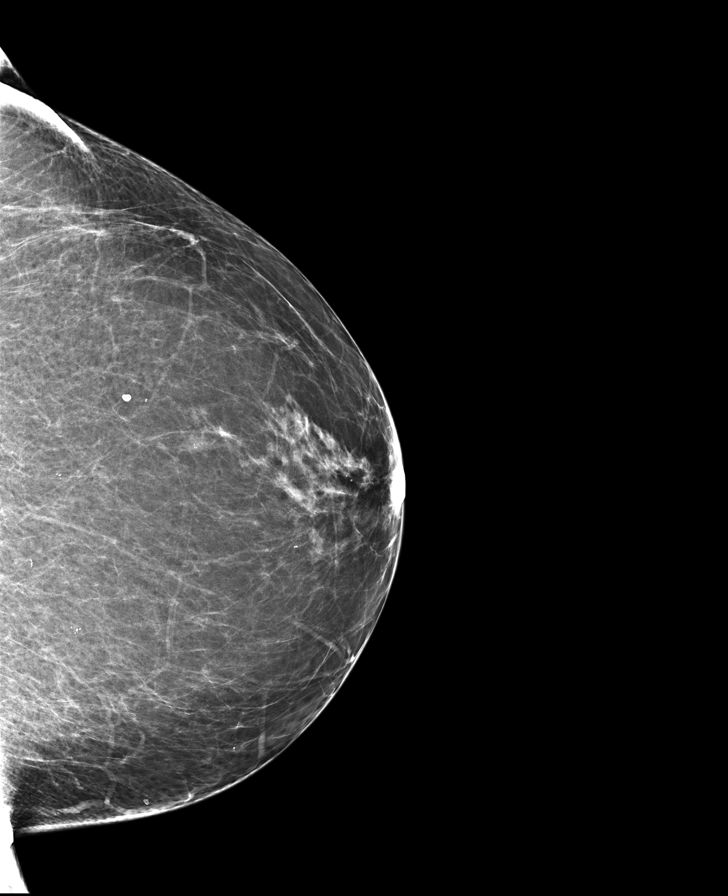

[L CC synth-2D]
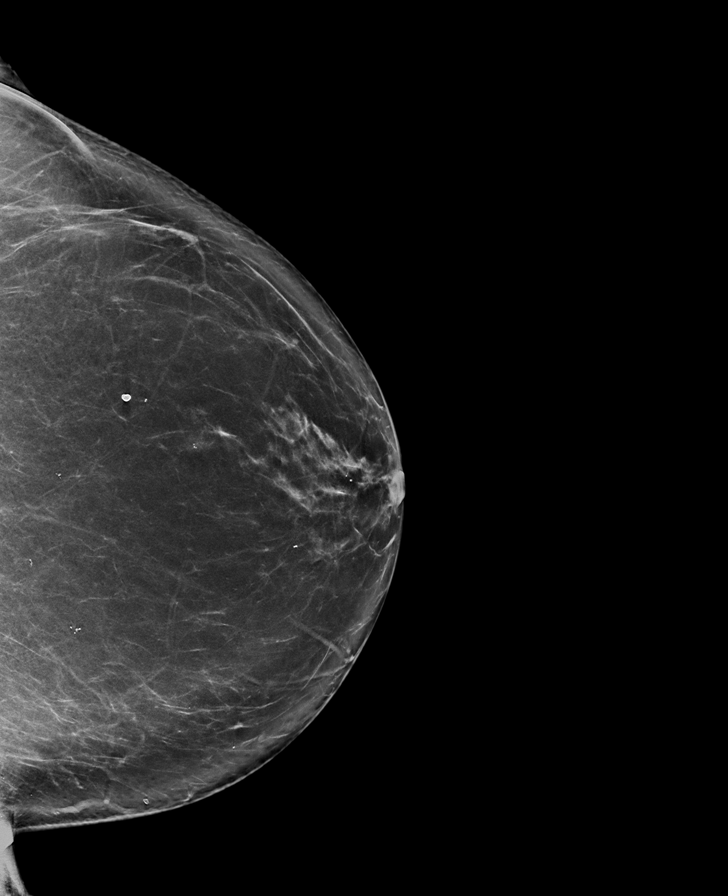

[L MLO synth-2D]
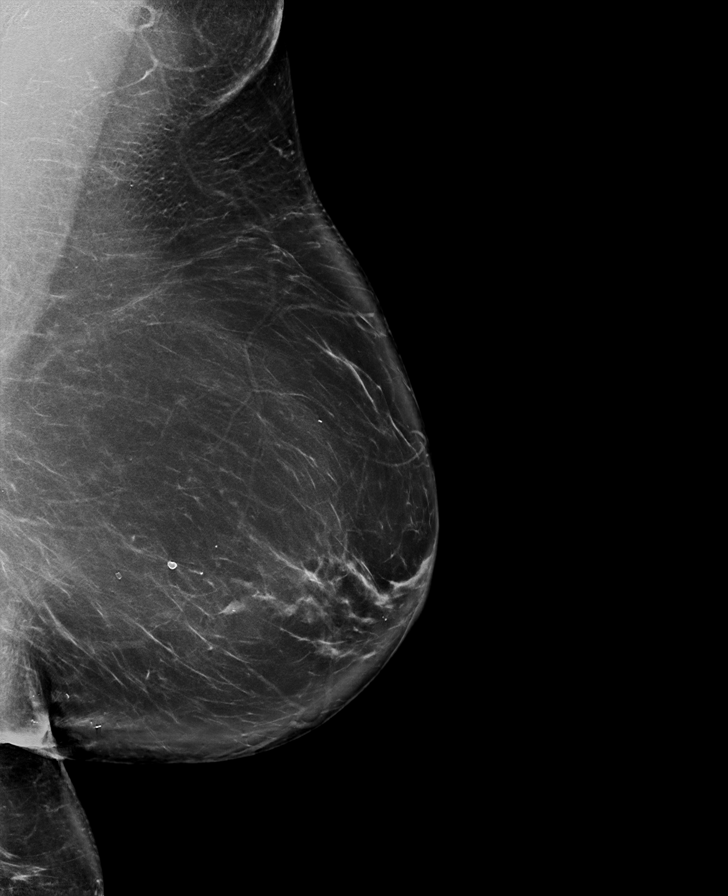

[L MLO]
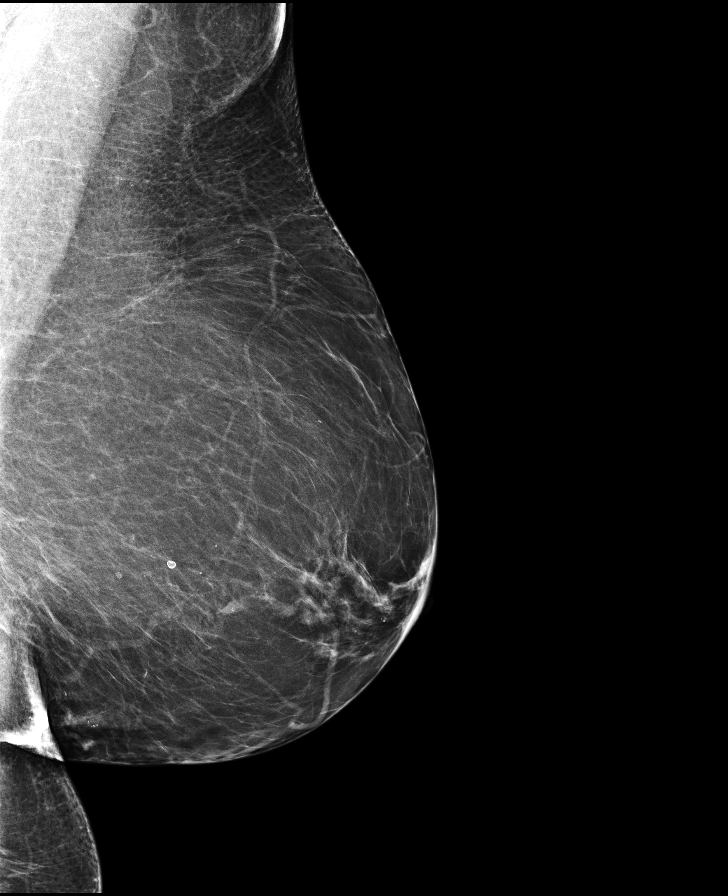

[L MLO tomo · tomo slice 51/101.0]
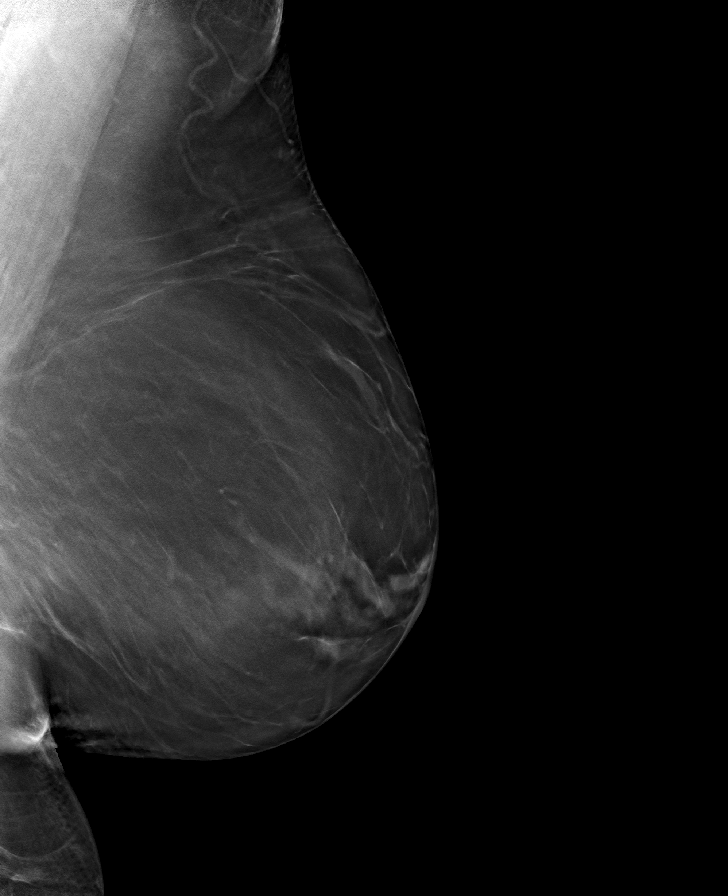

[8 of 17 positions shown; findings below may reference images not displayed]

ACR Breast Density Category b: There are scattered areas of
fibroglandular density.
FINDINGS: On today's additional views with 3D tomosynthesis and magnification,
3 coarse calcifications are confirmed within the lower outer
quadrant of the left breast. Associated asymmetry is stable compared
to multiple previous exams indicating benignity, presumed
fibroadenoma.

Mammographic images were processed with CAD.
IMPRESSION: Probably benign calcifications within the lower outer quadrant of
left breast, most likely related to an underlying fibroadenoma.
Recommend follow-up left breast diagnostic mammogram in 6 months to
ensure stability.

RECOMMENDATION:
Follow-up left breast diagnostic mammogram in 6 months.

I have discussed the findings and recommendations with the patient.
Results were also provided in writing at the conclusion of the
visit. If applicable, a reminder letter will be sent to the patient
regarding the next appointment.

BI-RADS CATEGORY  3: Probably benign.

## 2018-04-27 ENCOUNTER — Encounter (INDEPENDENT_AMBULATORY_CARE_PROVIDER_SITE_OTHER): Payer: Self-pay

## 2018-04-27 ENCOUNTER — Other Ambulatory Visit: Payer: Self-pay

## 2018-04-27 ENCOUNTER — Ambulatory Visit
Admission: RE | Admit: 2018-04-27 | Discharge: 2018-04-27 | Disposition: A | Discharge status: Home / Self Care | Payer: Medicare Other | Source: Ambulatory Visit | Attending: Nurse Practitioner | Admitting: Nurse Practitioner

## 2018-04-27 DIAGNOSIS — Z1231 Encounter for screening mammogram for malignant neoplasm of breast: Secondary | ICD-10-CM | POA: Diagnosis present

## 2019-10-31 ENCOUNTER — Other Ambulatory Visit: Payer: Self-pay | Admitting: Nurse Practitioner

## 2019-10-31 DIAGNOSIS — Z1231 Encounter for screening mammogram for malignant neoplasm of breast: Secondary | ICD-10-CM

## 2019-11-09 ENCOUNTER — Other Ambulatory Visit: Payer: Self-pay

## 2019-11-09 ENCOUNTER — Ambulatory Visit
Admission: RE | Admit: 2019-11-09 | Discharge: 2019-11-09 | Disposition: A | Discharge status: Home / Self Care | Payer: Medicare PPO | Source: Ambulatory Visit | Attending: Nurse Practitioner | Admitting: Nurse Practitioner

## 2019-11-09 DIAGNOSIS — Z1231 Encounter for screening mammogram for malignant neoplasm of breast: Secondary | ICD-10-CM | POA: Insufficient documentation

## 2020-10-22 ENCOUNTER — Ambulatory Visit: Admission: RE | Admit: 2020-10-22 | Payer: Medicare PPO | Source: Home / Self Care

## 2020-10-22 ENCOUNTER — Encounter: Admission: RE | Payer: Self-pay | Source: Home / Self Care

## 2020-10-22 SURGERY — COLONOSCOPY WITH PROPOFOL
Anesthesia: General

## 2020-11-11 ENCOUNTER — Other Ambulatory Visit: Payer: Self-pay | Admitting: Nurse Practitioner

## 2020-11-11 DIAGNOSIS — Z1231 Encounter for screening mammogram for malignant neoplasm of breast: Secondary | ICD-10-CM

## 2020-12-03 ENCOUNTER — Ambulatory Visit
Admission: RE | Admit: 2020-12-03 | Discharge: 2020-12-03 | Disposition: A | Discharge status: Home / Self Care | Payer: Medicare PPO | Source: Ambulatory Visit | Attending: Nurse Practitioner | Admitting: Nurse Practitioner

## 2020-12-03 ENCOUNTER — Other Ambulatory Visit: Payer: Self-pay

## 2020-12-03 DIAGNOSIS — Z1231 Encounter for screening mammogram for malignant neoplasm of breast: Secondary | ICD-10-CM | POA: Diagnosis not present

## 2021-11-18 ENCOUNTER — Other Ambulatory Visit: Payer: Self-pay | Admitting: Nurse Practitioner

## 2021-11-18 DIAGNOSIS — Z1231 Encounter for screening mammogram for malignant neoplasm of breast: Secondary | ICD-10-CM

## 2021-12-04 ENCOUNTER — Ambulatory Visit
Admission: RE | Admit: 2021-12-04 | Discharge: 2021-12-04 | Disposition: A | Discharge status: Home / Self Care | Payer: Medicare PPO | Source: Ambulatory Visit | Attending: Nurse Practitioner | Admitting: Nurse Practitioner

## 2021-12-04 DIAGNOSIS — Z1231 Encounter for screening mammogram for malignant neoplasm of breast: Secondary | ICD-10-CM | POA: Diagnosis present

## 2022-05-26 ENCOUNTER — Encounter: Payer: Self-pay | Admitting: *Deleted

## 2022-05-29 ENCOUNTER — Encounter: Payer: Self-pay | Admitting: *Deleted

## 2022-06-01 ENCOUNTER — Ambulatory Visit
Admission: RE | Admit: 2022-06-01 | Discharge: 2022-06-01 | Disposition: A | Discharge status: Home / Self Care | Payer: Medicare PPO | Source: Ambulatory Visit | Attending: Gastroenterology | Admitting: Gastroenterology

## 2022-06-01 ENCOUNTER — Ambulatory Visit: Payer: Medicare PPO | Admitting: Anesthesiology

## 2022-06-01 ENCOUNTER — Encounter: Payer: Self-pay | Admitting: *Deleted

## 2022-06-01 ENCOUNTER — Encounter
Admission: RE | Disposition: A | Discharge status: Home / Self Care | Payer: Self-pay | Source: Ambulatory Visit | Attending: Gastroenterology

## 2022-06-01 ENCOUNTER — Other Ambulatory Visit: Payer: Self-pay

## 2022-06-01 DIAGNOSIS — D120 Benign neoplasm of cecum: Secondary | ICD-10-CM | POA: Insufficient documentation

## 2022-06-01 DIAGNOSIS — K219 Gastro-esophageal reflux disease without esophagitis: Secondary | ICD-10-CM | POA: Diagnosis not present

## 2022-06-01 DIAGNOSIS — K640 First degree hemorrhoids: Secondary | ICD-10-CM | POA: Diagnosis not present

## 2022-06-01 DIAGNOSIS — Z1211 Encounter for screening for malignant neoplasm of colon: Secondary | ICD-10-CM | POA: Diagnosis present

## 2022-06-01 HISTORY — PX: COLONOSCOPY WITH PROPOFOL: SHX5780

## 2022-06-01 SURGERY — COLONOSCOPY WITH PROPOFOL
Anesthesia: General

## 2022-06-01 MED ORDER — PROPOFOL 10 MG/ML IV BOLUS
INTRAVENOUS | Status: AC
  Filled 2022-06-01: qty 20

## 2022-06-01 MED ORDER — LIDOCAINE HCL (CARDIAC) PF 100 MG/5ML IV SOSY
PREFILLED_SYRINGE | INTRAVENOUS | Status: DC | PRN
  Administered 2022-06-01: 100 mg via INTRAVENOUS

## 2022-06-01 MED ORDER — PROPOFOL 10 MG/ML IV BOLUS
INTRAVENOUS | Status: DC | PRN
  Administered 2022-06-01: 90 mg via INTRAVENOUS
  Administered 2022-06-01: 140 ug/kg/min via INTRAVENOUS

## 2022-06-01 MED ORDER — SODIUM CHLORIDE 0.9 % IV SOLN: INTRAVENOUS | Status: DC

## 2022-06-01 NOTE — H&P (Signed)
Outpatient short stay form Pre-procedure 06/01/2022  Regis Bill, MD  Primary Physician: Myrene Buddy, NP  Reason for visit:  History of polyps  History of present illness:    77 y/o lady with history of GERD here for colonoscopy due to history of polyps of unknown type. Last colonoscopy in 2017 was unremarkable. No blood thinners. No family history of GI malignancies. History of hysterectomy.    Current Facility-Administered Medications:    0.9 %  sodium chloride infusion, , Intravenous, Continuous, Akhila Mahnken, Rossie Muskrat, MD, Last Rate: 20 mL/hr at 06/01/22 1231, New Bag at 06/01/22 1231  Medications Prior to Admission  Medication Sig Dispense Refill Last Dose   aspirin 81 MG tablet Take 81 mg by mouth daily.   05/31/2022   famotidine (PEPCID) 40 MG tablet Take 40 mg by mouth every evening.   05/31/2022   Omega-3 Fatty Acids (FISH OIL PO) Take by mouth.   05/31/2022   pantoprazole (PROTONIX) 20 MG tablet Take 20 mg by mouth daily.   05/31/2022   POLYETHYLENE GLYCOL 3350 PO Take 1 packet by mouth daily.   05/31/2022   Probiotic Product (PROBIOTIC PO) Take by mouth.   05/31/2022     Allergies  Allergen Reactions   Oxycodone Nausea And Vomiting   Sulfa Antibiotics Rash     Past Medical History:  Diagnosis Date   GERD (gastroesophageal reflux disease)    Restless leg syndrome    Wears dentures    full upper and lower    Review of systems:  Otherwise negative.    Physical Exam  Gen: Alert, oriented. Appears stated age.  HEENT: PERRLA. Lungs: No respiratory distress CV: RRR Abd: soft, benign, no masses Ext: No edema    Planned procedures: Proceed with colonoscopy. The patient understands the nature of the planned procedure, indications, risks, alternatives and potential complications including but not limited to bleeding, infection, perforation, damage to internal organs and possible oversedation/side effects from anesthesia. The patient agrees and gives  consent to proceed.  Please refer to procedure notes for findings, recommendations and patient disposition/instructions.     Regis Bill, MD [facility]

## 2022-06-01 NOTE — Interval H&P Note (Signed)
History and Physical Interval Note:  06/01/2022 12:37 PM  Alexandra Farmer  has presented today for surgery, with the diagnosis of HX OF COLON POLYP.  The various methods of treatment have been discussed with the patient and family. After consideration of risks, benefits and other options for treatment, the patient has consented to  Procedure(s): COLONOSCOPY WITH PROPOFOL (N/A) as a surgical intervention.  The patient's history has been reviewed, patient examined, no change in status, stable for surgery.  I have reviewed the patient's chart and labs.  Questions were answered to the patient's satisfaction.     Regis Bill  Ok to proceed with colonoscopy

## 2022-06-01 NOTE — Anesthesia Postprocedure Evaluation (Signed)
Anesthesia Post Note  Patient: Alexandra Farmer  Procedure(s) Performed: COLONOSCOPY WITH PROPOFOL  Patient location during evaluation: PACU Anesthesia Type: General Level of consciousness: awake and alert, oriented and patient cooperative Pain management: pain level controlled Vital Signs Assessment: post-procedure vital signs reviewed and stable Respiratory status: spontaneous breathing, nonlabored ventilation and respiratory function stable Cardiovascular status: blood pressure returned to baseline and stable Postop Assessment: adequate PO intake Anesthetic complications: no   No notable events documented.   Last Vitals:  Vitals:   06/01/22 1224 06/01/22 1316  BP: 135/84 126/73  Pulse: 75 83  Resp: 16 12  Temp: (!) 36.1 C (!) 36.3 C  SpO2: 99% 98%    Last Pain:  Vitals:   06/01/22 1339  TempSrc:   PainSc: 0-No pain                 Reed Breech

## 2022-06-01 NOTE — Op Note (Signed)
[facility] Patient Name: Alexandra Farmer Procedure Date: 06/01/2022 12:36 PM MRN: [id number] Account #: [id number] Date of Birth: 03/08/1945 Admit Type: Outpatient Age: 77 Room: [facility] ENDO ROOM 3 Gender: Female Note Status: Finalized Instrument Name: Nelda Marseille [id number] Procedure:             Colonoscopy Indications:           High risk colon cancer surveillance: Personal history                         of colonic polyps, Last colonoscopy 5 years ago Providers:             Eather Colas MD, MD Medicines:             Monitored Anesthesia Care Complications:         No immediate complications. Estimated blood loss:                         Minimal. Procedure:             Pre-Anesthesia Assessment:                        - Prior to the procedure, a History and Physical was                         performed, and patient medications and allergies were                         reviewed. The patient is competent. The risks and                         benefits of the procedure and the sedation options and                         risks were discussed with the patient. All questions                         were answered and informed consent was obtained.                         Patient identification and proposed procedure were                         verified by the physician, the nurse, the                         anesthesiologist, the anesthetist and the technician                         in the endoscopy suite. Mental Status Examination:                         alert and oriented. Airway Examination: normal                         oropharyngeal airway and neck mobility. Respiratory                         Examination: clear to auscultation. CV Examination:  normal. Prophylactic Antibiotics: The patient does not                         require prophylactic antibiotics. Prior                         Anticoagulants: The patient has taken  no anticoagulant                         or antiplatelet agents. ASA Grade Assessment: II - A                         patient with mild systemic disease. After reviewing                         the risks and benefits, the patient was deemed in                         satisfactory condition to undergo the procedure. The                         anesthesia plan was to use monitored anesthesia care                         (MAC). Immediately prior to administration of                         medications, the patient was re-assessed for adequacy                         to receive sedatives. The heart rate, respiratory                         rate, oxygen saturations, blood pressure, adequacy of                         pulmonary ventilation, and response to care were                         monitored throughout the procedure. The physical                         status of the patient was re-assessed after the                         procedure.                        After obtaining informed consent, the colonoscope was                         passed under direct vision. Throughout the procedure,                         the patient's blood pressure, pulse, and oxygen                         saturations were monitored continuously. The  Colonoscope was introduced through the anus and                         advanced to the the cecum, identified by appendiceal                         orifice and ileocecal valve. The colonoscopy was                         somewhat difficult due to restricted mobility of the                         colon. Successful completion of the procedure was                         aided by withdrawing the scope and replacing with the                         pediatric colonoscope. The patient tolerated the                         procedure well. The quality of the bowel preparation                         was good. The ileocecal valve, appendiceal orifice,                          and rectum were photographed. Findings:      The perianal and digital rectal examinations were normal.      A 3 mm polyp was found in the cecum. The polyp was sessile. The polyp       was removed with a cold snare. Resection and retrieval were complete.       Estimated blood loss was minimal.      Internal hemorrhoids were found during retroflexion. The hemorrhoids       were Grade I (internal hemorrhoids that do not prolapse).      The exam was otherwise without abnormality on direct and retroflexion       views. Impression:            - One 3 mm polyp in the cecum, removed with a cold                         snare. Resected and retrieved.                        - Internal hemorrhoids.                        - The examination was otherwise normal on direct and                         retroflexion views. Recommendation:        - Discharge patient to home.                        - Resume previous diet.                        -  Continue present medications.                        - Await pathology results.                        - Repeat colonoscopy is not recommended due to current                         age (43 years or older) for surveillance.                        - Return to referring physician as previously                         scheduled. Procedure Code(s):     --- Professional ---                        [phone], Colonoscopy, flexible; with removal of                         tumor(s), polyp(s), or other lesion(s) by snare                         technique Diagnosis Code(s):     --- Professional ---                        Z86.010, Personal history of colonic polyps                        D12.0, Benign neoplasm of cecum                        K64.0, First degree hemorrhoids CPT copyright 2022 [employer]. All rights reserved. The codes documented in this report are preliminary and upon coder review may  be revised to meet current  compliance requirements. Eather Colas MD, MD 06/01/2022 1:17:45 PM Number of Addenda: 0 Note Initiated On: 06/01/2022 12:36 PM Scope Withdrawal Time: 0 hours 6 minutes 27 seconds  Total Procedure Duration: 0 hours 21 minutes 38 seconds  Estimated Blood Loss:  Estimated blood loss was minimal.      [facility]

## 2022-06-01 NOTE — Anesthesia Preprocedure Evaluation (Addendum)
Anesthesia Evaluation  Patient identified by MRN, date of birth, ID band Patient awake    Reviewed: Allergy & Precautions, NPO status , Patient's Chart, lab work & pertinent test results  History of Anesthesia Complications Negative for: history of anesthetic complications  Airway Mallampati: I   Neck ROM: Full    Dental  (+) Lower Dentures, Upper Dentures   Pulmonary neg pulmonary ROS   Pulmonary exam normal breath sounds clear to auscultation       Cardiovascular Exercise Tolerance: Good negative cardio ROS Normal cardiovascular exam Rhythm:Regular Rate:Normal     Neuro/Psych negative neurological ROS     GI/Hepatic ,GERD  ,,  Endo/Other  negative endocrine ROS    Renal/GU negative Renal ROS     Musculoskeletal   Abdominal   Peds  Hematology negative hematology ROS (+)   Anesthesia Other Findings   Reproductive/Obstetrics                             Anesthesia Physical Anesthesia Plan  ASA: 2  Anesthesia Plan: General   Post-op Pain Management:    Induction: Intravenous  PONV Risk Score and Plan: 3 and Propofol infusion, TIVA and Treatment may vary due to age or medical condition  Airway Management Planned: Natural Airway  Additional Equipment:   Intra-op Plan:   Post-operative Plan:   Informed Consent: I have reviewed the patients History and Physical, chart, labs and discussed the procedure including the risks, benefits and alternatives for the proposed anesthesia with the patient or authorized representative who has indicated his/her understanding and acceptance.       Plan Discussed with: CRNA  Anesthesia Plan Comments: (LMA/GETA backup discussed.  Patient consented for risks of anesthesia including but not limited to:  - adverse reactions to medications - damage to eyes, teeth, lips or other oral mucosa - nerve damage due to positioning  - sore throat or  hoarseness - damage to heart, brain, nerves, lungs, other parts of body or loss of life  Informed patient about role of CRNA in peri- and intra-operative care.  Patient voiced understanding.)       Anesthesia Quick Evaluation

## 2022-06-01 NOTE — Transfer of Care (Signed)
Immediate Anesthesia Transfer of Care Note  Patient: Alexandra Farmer  Procedure(s) Performed: COLONOSCOPY WITH PROPOFOL  Patient Location: Endoscopy Unit  Anesthesia Type:General  Level of Consciousness: drowsy  Airway & Oxygen Therapy: Patient Spontanous Breathing  Post-op Assessment: Report given to RN and Post -op Vital signs reviewed and stable  Post vital signs: Reviewed and stable  Last Vitals:  Vitals Value Taken Time  BP 126/73 06/01/22 1318  Temp 36.3 C 06/01/22 1316  Pulse 82 06/01/22 1318  Resp 12 06/01/22 1318  SpO2 97 % 06/01/22 1318  Vitals shown include unvalidated device data.  Last Pain:  Vitals:   06/01/22 1316  TempSrc: Temporal  PainSc: 0-No pain         Complications: No notable events documented.

## 2022-06-02 LAB — SURGICAL PATHOLOGY

## 2022-06-03 ENCOUNTER — Encounter: Payer: Self-pay | Admitting: Gastroenterology

## 2022-11-24 ENCOUNTER — Other Ambulatory Visit: Payer: Self-pay | Admitting: Nurse Practitioner

## 2022-11-24 DIAGNOSIS — Z1231 Encounter for screening mammogram for malignant neoplasm of breast: Secondary | ICD-10-CM

## 2023-02-04 ENCOUNTER — Ambulatory Visit
Admission: RE | Admit: 2023-02-04 | Discharge: 2023-02-04 | Disposition: A | Discharge status: Home / Self Care | Payer: Medicare PPO | Source: Ambulatory Visit | Attending: Nurse Practitioner | Admitting: Nurse Practitioner

## 2023-02-04 DIAGNOSIS — Z1231 Encounter for screening mammogram for malignant neoplasm of breast: Secondary | ICD-10-CM | POA: Diagnosis not present

## 2023-02-17 ENCOUNTER — Other Ambulatory Visit: Payer: Self-pay

## 2023-02-17 ENCOUNTER — Inpatient Hospital Stay: Payer: Medicare PPO

## 2023-02-17 ENCOUNTER — Observation Stay
Admission: EM | Admit: 2023-02-17 | Discharge: 2023-02-18 | Disposition: A | Discharge status: Home / Self Care | Payer: Medicare PPO | Attending: Family Medicine | Admitting: Family Medicine

## 2023-02-17 ENCOUNTER — Emergency Department: Payer: Medicare PPO

## 2023-02-17 DIAGNOSIS — R911 Solitary pulmonary nodule: Secondary | ICD-10-CM | POA: Diagnosis not present

## 2023-02-17 DIAGNOSIS — I728 Aneurysm of other specified arteries: Secondary | ICD-10-CM | POA: Diagnosis present

## 2023-02-17 DIAGNOSIS — R918 Other nonspecific abnormal finding of lung field: Secondary | ICD-10-CM | POA: Diagnosis not present

## 2023-02-17 DIAGNOSIS — I10 Essential (primary) hypertension: Secondary | ICD-10-CM | POA: Diagnosis not present

## 2023-02-17 DIAGNOSIS — R110 Nausea: Secondary | ICD-10-CM | POA: Diagnosis not present

## 2023-02-17 DIAGNOSIS — R0989 Other specified symptoms and signs involving the circulatory and respiratory systems: Secondary | ICD-10-CM | POA: Diagnosis present

## 2023-02-17 DIAGNOSIS — R519 Headache, unspecified: Secondary | ICD-10-CM | POA: Diagnosis not present

## 2023-02-17 DIAGNOSIS — R2681 Unsteadiness on feet: Secondary | ICD-10-CM | POA: Insufficient documentation

## 2023-02-17 DIAGNOSIS — Z7982 Long term (current) use of aspirin: Secondary | ICD-10-CM | POA: Insufficient documentation

## 2023-02-17 DIAGNOSIS — R1111 Vomiting without nausea: Secondary | ICD-10-CM | POA: Diagnosis not present

## 2023-02-17 DIAGNOSIS — R9082 White matter disease, unspecified: Secondary | ICD-10-CM | POA: Diagnosis not present

## 2023-02-17 DIAGNOSIS — R079 Chest pain, unspecified: Secondary | ICD-10-CM | POA: Diagnosis not present

## 2023-02-17 DIAGNOSIS — Z79899 Other long term (current) drug therapy: Secondary | ICD-10-CM | POA: Diagnosis not present

## 2023-02-17 DIAGNOSIS — R42 Dizziness and giddiness: Principal | ICD-10-CM | POA: Insufficient documentation

## 2023-02-17 DIAGNOSIS — R0789 Other chest pain: Secondary | ICD-10-CM | POA: Diagnosis not present

## 2023-02-17 DIAGNOSIS — R269 Unspecified abnormalities of gait and mobility: Secondary | ICD-10-CM | POA: Diagnosis not present

## 2023-02-17 LAB — BASIC METABOLIC PANEL
Anion gap: 15 (ref 5–15)
BUN: 19 mg/dL (ref 8–23)
CO2: 23 mmol/L (ref 22–32)
Calcium: 9 mg/dL (ref 8.9–10.3)
Chloride: 107 mmol/L (ref 98–111)
Creatinine, Ser: 0.64 mg/dL (ref 0.44–1.00)
GFR, Estimated: >60 mL/min (ref >60)
Glucose, Bld: 102 mg/dL — ABNORMAL HIGH (ref 70–99)
Potassium: 3.6 mmol/L (ref 3.5–5.1)
Sodium: 145 mmol/L (ref 135–145)

## 2023-02-17 LAB — TROPONIN I (HIGH SENSITIVITY)
Troponin I (High Sensitivity): 3 ng/L (ref <18)
Troponin I (High Sensitivity): 4 ng/L (ref <18)

## 2023-02-17 LAB — URINALYSIS, W/ REFLEX TO CULTURE (INFECTION SUSPECTED)
Bacteria, UA: NONE SEEN
Bilirubin Urine: NEGATIVE
Glucose, UA: NEGATIVE mg/dL
Ketones, ur: NEGATIVE mg/dL
Leukocytes,Ua: NEGATIVE
Nitrite: NEGATIVE
Protein, ur: NEGATIVE mg/dL
Specific Gravity, Urine: 1.019 (ref 1.005–1.030)
pH: 7 (ref 5.0–8.0)

## 2023-02-17 LAB — CBC
HCT: 39.8 % (ref 36.0–46.0)
Hemoglobin: 13.6 g/dL (ref 12.0–15.0)
MCH: 29.7 pg (ref 26.0–34.0)
MCHC: 34.2 g/dL (ref 30.0–36.0)
MCV: 86.9 fL (ref 80.0–100.0)
Platelets: 241 10*3/uL (ref 150–400)
RBC: 4.58 MIL/uL (ref 3.87–5.11)
RDW: 12.7 % (ref 11.5–15.5)
WBC: 6.1 10*3/uL (ref 4.0–10.5)
nRBC: 0 % (ref 0.0–0.2)

## 2023-02-17 MED ORDER — STROKE: EARLY STAGES OF RECOVERY BOOK: Freq: Once | Status: AC

## 2023-02-17 MED ORDER — ONDANSETRON HCL 4 MG/2ML IJ SOLN
4.0000 mg | Freq: Once | INTRAMUSCULAR | Status: AC
  Administered 2023-02-17: 4 mg via INTRAVENOUS
  Filled 2023-02-17: qty 2

## 2023-02-17 MED ORDER — ACETAMINOPHEN 160 MG/5ML PO SOLN: 650.0000 mg | ORAL | Status: DC | PRN

## 2023-02-17 MED ORDER — SODIUM CHLORIDE 0.9 % IV BOLUS
500.0000 mL | Freq: Once | INTRAVENOUS | Status: AC
  Administered 2023-02-17: 500 mL via INTRAVENOUS

## 2023-02-17 MED ORDER — ATORVASTATIN CALCIUM 80 MG PO TABS
80.0000 mg | ORAL_TABLET | Freq: Every day | ORAL | Status: DC
  Administered 2023-02-18: 80 mg via ORAL
  Filled 2023-02-17: qty 1
  Filled 2023-02-17: qty 4

## 2023-02-17 MED ORDER — ASPIRIN 81 MG PO TBEC
81.0000 mg | DELAYED_RELEASE_TABLET | Freq: Every day | ORAL | Status: DC
Start: 2023-02-18 — End: 2023-02-18
  Administered 2023-02-18: 81 mg via ORAL
  Filled 2023-02-17: qty 1

## 2023-02-17 MED ORDER — ACETAMINOPHEN 325 MG PO TABS
650.0000 mg | ORAL_TABLET | ORAL | Status: DC | PRN
  Administered 2023-02-17 – 2023-02-18 (×2): 650 mg via ORAL
  Filled 2023-02-17 (×2): qty 2

## 2023-02-17 MED ORDER — SENNOSIDES-DOCUSATE SODIUM 8.6-50 MG PO TABS: 1.0000 | ORAL_TABLET | Freq: Every evening | ORAL | Status: DC | PRN

## 2023-02-17 MED ORDER — LABETALOL HCL 5 MG/ML IV SOLN: 10.0000 mg | INTRAVENOUS | Status: DC | PRN

## 2023-02-17 MED ORDER — ASPIRIN 81 MG PO CHEW
324.0000 mg | CHEWABLE_TABLET | Freq: Once | ORAL | Status: AC
  Administered 2023-02-17: 324 mg via ORAL
  Filled 2023-02-17: qty 4

## 2023-02-17 MED ORDER — IOHEXOL 350 MG/ML SOLN
75.0000 mL | Freq: Once | INTRAVENOUS | Status: AC | PRN
  Administered 2023-02-17: 75 mL via INTRAVENOUS

## 2023-02-17 MED ORDER — PRAMIPEXOLE DIHYDROCHLORIDE 0.25 MG PO TABS
0.2500 mg | ORAL_TABLET | Freq: Once | ORAL | Status: AC
  Administered 2023-02-17: 0.25 mg via ORAL
  Filled 2023-02-17: qty 1

## 2023-02-17 MED ORDER — LORAZEPAM 2 MG/ML IJ SOLN
0.5000 mg | INTRAMUSCULAR | Status: AC
  Administered 2023-02-17: 0.5 mg via INTRAVENOUS
  Filled 2023-02-17: qty 1

## 2023-02-17 MED ORDER — ACETAMINOPHEN 650 MG RE SUPP: 650.0000 mg | RECTAL | Status: DC | PRN

## 2023-02-17 MED ORDER — ENOXAPARIN SODIUM 40 MG/0.4ML IJ SOSY
40.0000 mg | PREFILLED_SYRINGE | INTRAMUSCULAR | Status: DC
  Administered 2023-02-17: 40 mg via SUBCUTANEOUS
  Filled 2023-02-17: qty 0.4

## 2023-02-17 MED ORDER — MECLIZINE HCL 25 MG PO TABS
25.0000 mg | ORAL_TABLET | Freq: Once | ORAL | Status: AC
  Administered 2023-02-17: 25 mg via ORAL
  Filled 2023-02-17: qty 1

## 2023-02-17 MED ORDER — SODIUM CHLORIDE 0.9 % IV SOLN: INTRAVENOUS | Status: DC

## 2023-02-17 NOTE — H&P (Addendum)
 History and Physical    Patient: Alexandra Farmer FMW:[id number] DOB: [birth year] DOA: 02/17/2023 DOS: the patient was seen and examined on 02/17/2023 PCP: Don Lauraine Collar, NP  Patient coming from: Home  Chief Complaint:  Chief Complaint  Patient presents with   Chest Pain   Dizziness   HPI: Alexandra Farmer is a 78 y.o. female with medical history significant of GERD, RLS presenting w/ suspected CVA. Pt reports waking up early this morning w/ dizziness, nausea. Also w/ gait instability.  Patient denies any prior episodes like this happening in the past.  Gait instability seem to be predominantly more on the right side with ambulation.  Mild vertiginous symptoms with the feeling of the room spinning.  No slurred speech or confusion.  Denies any known history of strokes in the past.  Mild chest pain.  No abdominal pain or diarrhea. Denies any tobacco or ETOH use.  Presented to ER afebrile, BP 150s/90s. Satting well on RA.  CBC and BMP within normal limits.  Troponin negative x 3.  EKG normal sinus rhythm.  CT head grossly stable.  CT angio of the chest and aorta are grossly stable.  Chest x-ray stable.   Review of Systems: As mentioned in the history of present illness. All other systems reviewed and are negative. Past Medical History:  Diagnosis Date   GERD (gastroesophageal reflux disease)    Restless leg syndrome    Wears dentures    full upper and lower   Past Surgical History:  Procedure Laterality Date   ABDOMINAL HYSTERECTOMY  02/10/1984   CATARACT EXTRACTION W/PHACO Right 12/10/2014   Procedure: CATARACT EXTRACTION PHACO AND INTRAOCULAR LENS PLACEMENT (IOC);  Surgeon: Donzell Arlyce Budd, MD;  Location: [facility];  Service: Ophthalmology;  Laterality: Right;   COLONOSCOPY N/A 03/05/2015   Procedure: COLONOSCOPY;  Surgeon: Deward CINDERELLA Piedmont, MD;  Location: [facility];  Service: Gastroenterology;  Laterality: N/A;   COLONOSCOPY WITH PROPOFOL  N/A 06/01/2022   Procedure:  COLONOSCOPY WITH PROPOFOL ;  Surgeon: Maryruth Ole DASEN, MD;  Location: [facility];  Service: Endoscopy;  Laterality: N/A;   ESOPHAGOGASTRODUODENOSCOPY N/A 03/05/2015   Procedure: ESOPHAGOGASTRODUODENOSCOPY (EGD);  Surgeon: Deward CINDERELLA Piedmont, MD;  Location: [facility];  Service: Gastroenterology;  Laterality: N/A;   EYE SURGERY     RETINAL DETACHMENT SURGERY Right 02/09/2013   ROTATOR CUFF REPAIR  02/10/2007   Social History:  reports that she has never smoked. She has never used smokeless tobacco. She reports that she does not drink alcohol and does not use drugs.  Allergies  Allergen Reactions   Oxycodone Nausea And Vomiting   Sulfa Antibiotics Rash    Family History  Problem Relation Age of Onset   Breast cancer Neg Hx     Prior to Admission medications   Medication Sig Start Date End Date Taking? Authorizing Provider  aspirin  81 MG tablet Take 81 mg by mouth daily.   Yes [provider]  famotidine (PEPCID) 40 MG tablet Take 40 mg by mouth every evening.   Yes [provider]  Omega-3 Fatty Acids (FISH OIL PO) Take by mouth.   Yes [provider]  pantoprazole (PROTONIX) 20 MG tablet Take 20 mg by mouth daily.   Yes [provider]  POLYETHYLENE GLYCOL 3350 PO Take 1 packet by mouth daily.   Yes [provider]  Probiotic Product (PROBIOTIC PO) Take by mouth.   Yes [provider]    Physical Exam: Vitals:   02/17/23 9448  02/17/23 0808 02/17/23 0949 02/17/23 1140  BP: (!) 155/98  139/74 (!) 159/97  Pulse: 62  66 64  Resp: 16  17 18   Temp: 98.1 F (36.7 C)  97.6 F (36.4 C) 97.9 F (36.6 C)  TempSrc: Oral  Oral   SpO2: 99% 100% 100% 94%  Weight: 81.6 kg     Height: 5' 3 (1.6 m)      Physical Exam Constitutional:      Appearance: She is normal weight.  HENT:     Head: Normocephalic and atraumatic.     Nose: Nose normal.     Mouth/Throat:     Mouth: Mucous membranes are moist.  Eyes:     Pupils: Pupils  are equal, round, and reactive to light.  Cardiovascular:     Rate and Rhythm: Normal rate and regular rhythm.  Pulmonary:     Effort: Pulmonary effort is normal.  Abdominal:     General: Bowel sounds are normal.  Musculoskeletal:        General: Normal range of motion.     Cervical back: Normal range of motion.  Skin:    General: Skin is warm.  Neurological:     General: No focal deficit present.     Comments: + gait instability w/ mild R sided drift   Psychiatric:        Mood and Affect: Mood normal.     Data Reviewed:  There are no new results to review at this time.  CT Angio Chest Aorta W and/or Wo Contrast CLINICAL DATA:  Acute onset dizziness, nausea, and vomiting upon waking with hypertension and chest pain  EXAM: CT ANGIOGRAPHY CHEST WITH CONTRAST  TECHNIQUE: Noncontrast CT of the chest was initially obtained.  Multidetector CT imaging of the chest was performed using the standard protocol during bolus administration of intravenous contrast. Multiplanar CT image reconstructions and MIPs were obtained to evaluate the vascular anatomy.  RADIATION DOSE REDUCTION: This exam was performed according to the departmental dose-optimization program which includes automated exposure control, adjustment of the mA and/or kV according to patient size and/or use of iterative reconstruction technique.  CONTRAST:  75mL OMNIPAQUE  IOHEXOL  350 MG/ML SOLN  COMPARISON:  Same day chest radiograph, CTA chest dated 07/09/2004  FINDINGS: Cardiovascular: Preferential opacification of the thoracic aorta. No evidence of thoracic aortic aneurysm or dissection. Normal heart size. No pericardial effusion. Left vertebral artery arises directly from the aorta. No central pulmonary emboli.  Mediastinum/Nodes: Imaged thyroid gland without nodules meeting criteria for imaging follow-up by size. Small hiatal hernia. No pathologically enlarged axillary, supraclavicular, mediastinal,  or hilar lymph nodes.  Lungs/Pleura: The central airways are patent. 3 mm right lower lobe ground-glass nodule (8:106). No focal consolidation. No pneumothorax. No pleural effusion.  Upper abdomen: Diffuse hepatic parenchymal hypoattenuation can be seen with hepatic steatosis. Splenic artery aneurysm measures 9 x 8 mm (10:76).  Musculoskeletal: No acute or abnormal lytic or blastic osseous lesions. Right humeral bone anchor. Multilevel degenerative changes of the thoracic spine.  Review of the MIP images confirms the above findings.  IMPRESSION: 1. No evidence of thoracic aortic aneurysm or dissection. No central pulmonary emboli. 2. No acute intrathoracic abnormality. 3. A 3 mm right lower lobe ground-glass nodule. No follow-up recommended. This recommendation follows the consensus statement: Guidelines for Management of Incidental Pulmonary Nodules Detected on CT Images: From the Fleischner Society 2017; Radiology 2017; 284:228-243. 4. A 9 mm splenic artery aneurysm. Recommend referral to or continued care with vascular specialist. (Ref.: J  Vasc Surg. 2018; 67:2-77 and J Am Coll Radiol 2013;10(10):789-794.)  Electronically Signed   By: Limin  Xu M.D.   On: 02/17/2023 09:29 CT Head Wo Contrast CLINICAL DATA:  Headache, new onset. Dizziness. Nausea and vomiting.  EXAM: CT HEAD WITHOUT CONTRAST  TECHNIQUE: Contiguous axial images were obtained from the base of the skull through the vertex without intravenous contrast.  RADIATION DOSE REDUCTION: This exam was performed according to the departmental dose-optimization program which includes automated exposure control, adjustment of the mA and/or kV according to patient size and/or use of iterative reconstruction technique.  COMPARISON:  None Available.  FINDINGS: Brain: Mild age related volume loss. No evidence of old or acute focal infarction, mass lesion, hemorrhage, hydrocephalus or extra-axial  collection.  Vascular: No abnormal vascular finding.  Skull: Normal  Sinuses/Orbits: Clear/normal  Other: None  IMPRESSION: No acute or reversible finding. Mild age related volume loss.  Electronically Signed   By: Oneil Officer M.D.   On: 02/17/2023 09:10 DG Chest 2 View CLINICAL DATA:  78 year old female with chest pain and dizziness. Hypertensive.  EXAM: CHEST - 2 VIEW  COMPARISON:  Portable chest and Chest CT 07/09/2004.  FINDINGS: Seated AP and lateral views of the chest at 0616 hours. Mediastinal contours remain within normal limits. Normal lung volumes. Visualized tracheal air column is within normal limits. No pneumothorax, pulmonary edema, pleural effusion or confluent lung opacity. No acute osseous abnormality identified. Small bone anchor in the proximal right humerus. Negative visible bowel gas.  IMPRESSION: No acute cardiopulmonary abnormality.  Electronically Signed   By: VEAR Hurst M.D.   On: 02/17/2023 07:55  Lab Results  Component Value Date   WBC 6.1 02/17/2023   HGB 13.6 02/17/2023   HCT 39.8 02/17/2023   MCV 86.9 02/17/2023   PLT 241 02/17/2023   Last metabolic panel Lab Results  Component Value Date   GLUCOSE 102 (H) 02/17/2023   NA 145 02/17/2023   K 3.6 02/17/2023   CL 107 02/17/2023   CO2 23 02/17/2023   BUN 19 02/17/2023   CREATININE 0.64 02/17/2023   GFRNONAA >60 02/17/2023   CALCIUM  9.0 02/17/2023   ANIONGAP 15 02/17/2023    Assessment and Plan: * Suspected cerebrovascular accident (CVA) Positive dizziness and gait instability since earlier this morning Gait instability with more right-sided predominance on evaluation CT head stable Overall presentation concerning for possible posterior CVA Vertiginous symptoms also on differential diagnosis Will plan for formal CVA evaluation including MRI of the brain, 2D echo, CTA of the head neck, risk stratification labs Start aspirin  and  Lipitor  Meclizine  x 1 Formal neurology  consult if imaging indicative of acute pathology Monitor   Hypertension BP 150s over 90s on presentation Allow for permissive hypertension in the setting of CVA evaluation As needed IV labetalol  for systolic pressure greater than 220 or diastolic pressure greater than 110   Greater than 50% was spent in counseling and coordination of care with patient Total encounter time 80 minutes or more    Advance Care Planning:   Code Status: Full Code   Consults: None at present   Family Communication: Family at the bedside   Severity of Illness: The appropriate patient status for this patient is INPATIENT. Inpatient status is judged to be reasonable and necessary in order to provide the required intensity of service to ensure the patient's safety. The patient's presenting symptoms, physical exam findings, and initial radiographic and laboratory data in the context of their chronic comorbidities is felt to place  them at high risk for further clinical deterioration. Furthermore, it is not anticipated that the patient will be medically stable for discharge from the hospital within 2 midnights of admission.   * I certify that at the point of admission it is my clinical judgment that the patient will require inpatient hospital care spanning beyond 2 midnights from the point of admission due to high intensity of service, high risk for further deterioration and high frequency of surveillance required.*  Author: Elspeth JINNY Masters, MD 02/17/2023 11:58 AM  For on call review [email or web address].

## 2023-02-17 NOTE — ED Notes (Signed)
 This nurse attempted a IV attempt twice unsuccessfully. IV team consulted

## 2023-02-17 NOTE — Plan of Care (Signed)

## 2023-02-17 NOTE — Progress Notes (Signed)
 PT Cancellation Note  Patient Details Name: Alexandra Farmer MRN: [id number] DOB: 1945/10/29   Cancelled Treatment:    Reason Eval/Treat Not Completed: Patient at procedure or test/unavailable Patient at MRI. Will re-attempt at later date/time.    [name] 02/17/2023, 3:09 PM

## 2023-02-17 NOTE — Progress Notes (Signed)
 SLP Cancellation Note  Patient Details Name: Alexandra Farmer MRN: [id number] DOB: 09-02-1945   Cancelled treatment:       Reason Eval/Treat Not Completed: SLP screened, no needs identified, will sign off (chart reviewed; consulted NSG and met w/ pt/Family in room.)  Pt denied any difficulty swallowing and is currently on a regular diet; tolerates swallowing pills w/ water  per NSG.  Pt conversed in conversation w/out expressive/receptive deficits noted; pt denied any speech-language deficits. Speech clear; intelligible. Pt talking on her cell phone upon entering room(no deficits noted). No further skilled ST services indicated as pt appears at her baseline. Pt agreed. NSG to reconsult if any change in status while admitted.     Comer Portugal, MS, CCC-SLP Speech Language Pathologist Rehab Services; [facility] [phone] (ascom) Lannie Heaps 02/17/2023, 12:50 PM

## 2023-02-17 NOTE — Assessment & Plan Note (Addendum)
 Stroke ruled out on MRI brain. Vertigo / BPPV in the differential given room spinning description and unstable gait which improved with meclizine . Stroke evaluation was unremarkable. --Referral to ENT submitted --Meclizine  PRN for recurrent symptoms --Low dose Lipitor  10 mg given LDL > 100  --Ongoing risk factor reduction recommended with PCP follow ups

## 2023-02-17 NOTE — ED Notes (Signed)
 Pt urinated in the bed but was able to ambulate to restroom. Gate was steady but pt stated she felt like she was leaning toward one side. Pt did become wobbily once we got closer to restroom. Pt was given a set of scrub bottoms and this nurse provided a full linen change.

## 2023-02-17 NOTE — ED Notes (Signed)
 This nurse gave pt family warm blankets per family request

## 2023-02-17 NOTE — ED Triage Notes (Addendum)
 Pt to ED via EMS from home, pt reports she woke up this morning with dizziness and n/v. Pt states she checked her BP this morning and it was elevated, pt has no hx HTN. Pt also states she is now having chest pain. Pt denies weakness or numbness in extremities, speech clear, denies vision changes.

## 2023-02-17 NOTE — ED Provider Notes (Signed)
 [facility] Provider Note    Event Date/Time   First MD Initiated Contact with Patient 02/17/23 [phone]     (approximate)   History   Chest Pain and Dizziness   HPI  Alexandra Farmer is a 78 y.o. female past medical tree significant for GERD, who presents to the emergency department for dizziness.  Patient states that she woke up this morning feeling dizzy.  States that she felt like the room was spinning and did not feel well.  Had an episode of nausea and vomiting.  States that she had significant dizziness but after short amount of time her dizziness has improved.  States that she is now having some mild chest pain.  Denies any tearing chest pain.  Denies any numbness or weakness in extremities.  Denies any dizziness while sitting in the bed but does state that she still feels off.  No recent falls or head trauma.  No neck pain up evaluation.     Physical Exam   Triage Vital Signs: ED Triage Vitals [02/17/23 0551]  Encounter Vitals Group     BP (!) 155/98     Systolic BP Percentile      Diastolic BP Percentile      Pulse Rate 62     Resp 16     Temp 98.1 F (36.7 C)     Temp Source Oral     SpO2 99 %     Weight 180 lb (81.6 kg)     Height 5' 3 (1.6 m)     Head Circumference      Peak Flow      Pain Score 0     Pain Loc      Pain Education      Exclude from Growth Chart     Most recent vital signs: Vitals:   02/17/23 0808 02/17/23 0949  BP:  139/74  Pulse:  66  Resp:  17  Temp:  97.6 F (36.4 C)  SpO2: 100% 100%    Physical Exam Constitutional:      Appearance: She is well-developed.  HENT:     Head: Atraumatic.  Eyes:     Extraocular Movements: Extraocular movements intact.     Conjunctiva/sclera: Conjunctivae normal.     Pupils: Pupils are equal, round, and reactive to light.     Comments: No nystagmus  Cardiovascular:     Rate and Rhythm: Regular rhythm.     Heart sounds: Normal heart sounds.  Pulmonary:     Effort: No  respiratory distress.  Abdominal:     General: There is no distension.  Musculoskeletal:        General: Normal range of motion.     Cervical back: Normal range of motion.  Skin:    General: Skin is warm.  Neurological:     Mental Status: She is alert. Mental status is at baseline.     GCS: GCS eye subscore is 4. GCS verbal subscore is 5. GCS motor subscore is 6.     Cranial Nerves: Cranial nerves 2-12 are intact.     Sensory: Sensation is intact.     Motor: Motor function is intact.     Coordination: Finger-Nose-Finger Test abnormal.     Gait: Gait abnormal.     Comments: Patient with dysmetria with finger-to-nose to the left hand.  Abnormal gait with ambulation.     IMPRESSION / MDM / ASSESSMENT AND PLAN / ED COURSE  I reviewed the triage vital  signs and the nursing notes.  Differential diagnosis including CVA, dissection, ACS, peripheral vertigo.  Given significant improvement of her symptoms, not a TNK candidate, not activated code stroke.  EKG  I, Clotilda Punter, the attending physician, personally viewed and interpreted this ECG.   Rate: Normal  Rhythm: Normal sinus  Axis: Normal  Intervals: Normal  ST&T Change: None  No tachycardic or bradycardic dysrhythmias while on cardiac telemetry.  RADIOLOGY I independently reviewed imaging, my interpretation of imaging: CT scan of the head with no signs of intracranial hemorrhage or infarction.  CTA with no signs of dissection.  Incidental findings noted of splenic aneurysm that needed vascular follow-up and a nodule on her lung.  Discussed these incidental findings with the patient and follow-up plan.  LABS (all labs ordered are listed, but only abnormal results are displayed) Labs interpreted as -    Labs Reviewed  BASIC METABOLIC PANEL - Abnormal; Notable for the following components:      Result Value   Glucose, Bld 102 (*)    All other components within normal limits  CBC  URINALYSIS, W/ REFLEX TO CULTURE  (INFECTION SUSPECTED)  TROPONIN I (HIGH SENSITIVITY)  TROPONIN I (HIGH SENSITIVITY)     MDM    Patient was given IV fluids and IV Zofran .  Abdomen is nontender to palpation.  CTA with dissection study with no findings of acute dissection.  CT scan of the head with no signs of intracranial hemorrhage.  Incidental findings discussed with the patient.  On reevaluation patient states that she feels better.  However when attempting to ambulate patient is off balance, is not ambulating at her normal.  Feels like she is falling to the right side.  Does endorse worsening dizziness with ambulation.  Negative Dix-Hallpike maneuver.  No active nystagmus on exam.  Given ongoing gait instability with vertiginous symptoms concern for possible posterior circulation CVA.  Patient was given aspirin .  Consulted hospitalist for admission for CVA workup for ongoing dizziness and gait instability.   PROCEDURES:  Critical Care performed: No  Procedures  Patient's presentation is most consistent with acute presentation with potential threat to life or bodily function.   MEDICATIONS ORDERED IN ED: Medications  aspirin  chewable tablet 324 mg (has no administration in time range)  sodium chloride  0.9 % bolus 500 mL (0 mLs Intravenous Stopped 02/17/23 0958)  ondansetron  (ZOFRAN ) injection 4 mg (4 mg Intravenous Given 02/17/23 0842)  iohexol  (OMNIPAQUE ) 350 MG/ML injection 75 mL (75 mLs Intravenous Contrast Given 02/17/23 0847)    FINAL CLINICAL IMPRESSION(S) / ED DIAGNOSES   Final diagnoses:  Dizziness  Chest pain, unspecified type  Gait instability     Rx / DC Orders   ED Discharge Orders     None        Note:  This document was prepared using Dragon voice recognition software and may include unintentional dictation errors.   Punter Clotilda, MD 02/17/23 1038

## 2023-02-17 NOTE — Assessment & Plan Note (Addendum)
 Initially permissive HTN until stroke ruled out.  Resume home regimen at discharge.

## 2023-02-17 NOTE — Evaluation (Signed)
 Occupational Therapy Evaluation Patient Details Name: Alexandra Farmer MRN: [id number] DOB: 1945-08-26 Today's Date: 02/17/2023   History of Present Illness 78 y.o. female with PMHx: GERD, RLS presenting w/ suspected CVA. Pt reports waking up early this morning w/ dizziness, nausea. Also w/ gait instability. Gait instability seem to be predominantly more on the right side with ambulation. Mild vertiginous symptoms with the feeling of the room spinning.  MRI pending. CT negative.   Clinical Impression   Pt was seen for OT evaluation this date. Prior to hospital admission, pt was living at home with her husband and reports IND with all ADLs, IADLs and mobility with no AD use. 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   Pt presents to acute OT demonstrating little no to functional decline or impairment in ADL performance. She demo bed mobility with MOD I. LB dressing seated EOB with MOD I. No UE deficits-sensation, coordination, strength all intact. She reports some throbbing pain in her R eye and dizziness with initial change in movements. BP sitting: 169/82, standing 150/86, and after mobility trial 166/90. STS from EOB to RW with CGA, then pt ambulated to the bathroom using RW with SUP and performed toilet transfer using grab bar with SUP. Ambulated to bathroom door with RW then no AD back to the bed requiring SBA. R foot inversion noted with mobility causing slower pace-pt is aware and states that this is why she feels like she is favoring the R side. No LOB noted and full sensation intact to BLEs. Pt would benefit from PT evaluation. No acute OT needs and OT to sign off in house with pt/daughter at bedside in agreement. Can re consult if new needs arise.       If plan is discharge home, recommend the following:      Functional Status Assessment  Patient has not had a recent decline in their functional status  Equipment Recommendations  None recommended by OT     Recommendations for Other Services       Precautions / Restrictions Precautions Precautions: Fall Restrictions Weight Bearing Restrictions Per Provider Order: No      Mobility Bed Mobility Overal bed mobility: Needs Assistance Bed Mobility: Supine to Sit     Supine to sit: Contact guard, HOB elevated, Used rails     General bed mobility comments: no physical assist provided, verb cues for hand placement    Transfers Overall transfer level: Needs assistance Equipment used: Rolling walker (2 wheels) Transfers: Sit to/from Stand Sit to Stand: Contact guard assist, Supervision           General transfer comment: CGA to SBA for STS from EOB, ambulated to BR using RW with SUP; toilet transfer with use of grab bar SUP and ambulated back to bed with no AD and close SBA from OT      Balance Overall balance assessment: Needs assistance Sitting-balance support: Feet supported Sitting balance-Leahy Scale: Good Sitting balance - Comments: steady to doff/don socks at EOB   Standing balance support: No upper extremity supported Standing balance-Leahy Scale: Fair Standing balance comment: SBA for mobility in room with slower pace and no AD use                           ADL either performed or assessed with clinical judgement   ADL Overall ADL's : Needs assistance/impaired  Lower Body Dressing: Modified independent;Sitting/lateral leans   Toilet Transfer: Supervision/safety;Grab bars           Functional mobility during ADLs: Supervision/safety;Rolling walker (2 wheels) General ADL Comments: SUP with walker and close SBA without walker     Vision         Perception         Praxis         Pertinent Vitals/Pain Pain Assessment Pain Assessment: Faces Faces Pain Scale: Hurts little more Pain Location: R eye Pain Descriptors / Indicators: Throbbing Pain Intervention(s): Monitored during session     Extremity/Trunk  Assessment Upper Extremity Assessment Upper Extremity Assessment: Overall WFL for tasks assessed   Lower Extremity Assessment Lower Extremity Assessment: RLE deficits/detail;Defer to PT evaluation RLE Deficits / Details: R foot inversion noted with gait       Communication Communication Communication: No apparent difficulties Cueing Techniques: Verbal cues   Cognition Arousal: Alert Behavior During Therapy: WFL for tasks assessed/performed Overall Cognitive Status: Within Functional Limits for tasks assessed                                 General Comments: alert and oriented x4; pleasant and cooperative     General Comments  BP monitored sitting: 169/82; standing: 150/86; after mobility trial 166/90; minimal dizziness initially that improved    Exercises Other Exercises Other Exercises: Edu on role of OT in acute setting and importance of therapy.   Shoulder Instructions      Home Living Family/patient expects to be discharged to:: Private residence Living Arrangements: Spouse/significant other Available Help at Discharge: Family;Available 24 hours/day Type of Home: House Home Access: Ramped entrance;Other (comment) (3 steps and a rail to the laundry room)     Home Layout: One level     Bathroom Shower/Tub: Chief Strategy Officer: Handicapped height Bathroom Accessibility: Yes How Accessible: Accessible via walker Home Equipment: None          Prior Functioning/Environment Prior Level of Function : Independent/Modified Independent             Mobility Comments: IND ADLs Comments: IND        OT Problem List:        OT Treatment/Interventions:      OT Goals(Current goals can be found in the care plan section)    OT Frequency:      Co-evaluation              AM-PAC OT 6 Clicks Daily Activity     Outcome Measure Help from another person eating meals?: None Help from another person taking care of personal  grooming?: None Help from another person toileting, which includes using toliet, bedpan, or urinal?: None Help from another person bathing (including washing, rinsing, drying)?: None Help from another person to put on and taking off regular upper body clothing?: None Help from another person to put on and taking off regular lower body clothing?: None 6 Click Score: 24   End of Session Equipment Utilized During Treatment: Rolling walker (2 wheels) Nurse Communication: Mobility status  Activity Tolerance: Patient tolerated treatment well Patient left: in bed;with call bell/phone within reach;with bed alarm set;with family/visitor present  OT Visit Diagnosis: Other abnormalities of gait and mobility (R26.89)                Time: 8675-8645 OT Time Calculation (min): 30 min Charges:  OT General Charges $OT  Visit: 1 Visit OT Evaluation $OT Eval Low Complexity: 1 Low OT Treatments $[place] Care/Home Management : 8-22 mins Kambrea Carrasco, OTR/L 02/17/23, 4:30 PM  Brick Ketcher E Allyne Hebert 02/17/2023, 4:26 PM

## 2023-02-18 ENCOUNTER — Inpatient Hospital Stay (HOSPITAL_BASED_OUTPATIENT_CLINIC_OR_DEPARTMENT_OTHER)
Admit: 2023-02-18 | Discharge: 2023-02-18 | Disposition: A | Discharge status: Home / Self Care | Payer: Medicare PPO | Attending: Family Medicine | Admitting: Family Medicine

## 2023-02-18 DIAGNOSIS — R911 Solitary pulmonary nodule: Secondary | ICD-10-CM | POA: Diagnosis present

## 2023-02-18 DIAGNOSIS — I728 Aneurysm of other specified arteries: Secondary | ICD-10-CM | POA: Diagnosis present

## 2023-02-18 DIAGNOSIS — R079 Chest pain, unspecified: Secondary | ICD-10-CM | POA: Diagnosis not present

## 2023-02-18 DIAGNOSIS — R0989 Other specified symptoms and signs involving the circulatory and respiratory systems: Secondary | ICD-10-CM | POA: Diagnosis not present

## 2023-02-18 LAB — LIPID PANEL
Cholesterol: 193 mg/dL (ref 0–200)
HDL: 74 mg/dL (ref >40)
LDL Cholesterol: 102 mg/dL — ABNORMAL HIGH (ref 0–99)
Total CHOL/HDL Ratio: 2.6 {ratio}
Triglycerides: 86 mg/dL (ref <150)
VLDL: 17 mg/dL (ref 0–40)

## 2023-02-18 LAB — ECHOCARDIOGRAM COMPLETE
AR max vel: 2.65 cm2
AV Area VTI: 2.9 cm2
AV Area mean vel: 2.6 cm2
AV Mean grad: 4 mm[Hg]
AV Peak grad: 6.6 mm[Hg]
Ao pk vel: 1.28 m/s
Area-P 1/2: 3.54 cm2
Calc EF: 65.2 %
Height: 63 in
MV VTI: 3.2 cm2
S' Lateral: 2.8 cm
Single Plane A2C EF: 56.7 %
Single Plane A4C EF: 68.9 %
Weight: 2880 [oz_av]

## 2023-02-18 LAB — HEMOGLOBIN A1C
Hgb A1c MFr Bld: 5.8 % — ABNORMAL HIGH (ref 4.8–5.6)
Mean Plasma Glucose: 120 mg/dL

## 2023-02-18 MED ORDER — MECLIZINE HCL 25 MG PO TABS: 25.0000 mg | ORAL_TABLET | Freq: Three times a day (TID) | ORAL | 0 refills | Status: AC | PRN

## 2023-02-18 MED ORDER — ATORVASTATIN CALCIUM 10 MG PO TABS: 10.0000 mg | ORAL_TABLET | Freq: Every day | ORAL | 2 refills | Status: DC

## 2023-02-18 MED ORDER — ATORVASTATIN CALCIUM 10 MG PO TABS: 10.0000 mg | ORAL_TABLET | Freq: Every day | ORAL | 2 refills | Status: AC

## 2023-02-18 MED ORDER — MECLIZINE HCL 25 MG PO TABS: 25.0000 mg | ORAL_TABLET | Freq: Three times a day (TID) | ORAL | 0 refills | Status: DC | PRN

## 2023-02-18 MED ORDER — ONDANSETRON HCL 4 MG/2ML IJ SOLN: 4.0000 mg | Freq: Four times a day (QID) | INTRAMUSCULAR | Status: DC | PRN

## 2023-02-18 NOTE — Evaluation (Signed)
 Physical Therapy Evaluation Patient Details Name: Alexandra Farmer MRN: [id number] DOB: 08-28-1945 Today's Date: 02/18/2023  History of Present Illness  78 y.o. female with PMHx: GERD, RLS presenting w/ suspected CVA. Pt reports waking up early this morning w/ dizziness, nausea. Also w/ gait instability. Gait instability seem to be predominantly more on the right side with ambulation. Mild vertiginous symptoms with the feeling of the room spinning.  MRI pending. CT negative.  Clinical Impression  Patient received in bed, family at bedside. She reports improvement this morning. Patient ambulated to bathroom without AD. She is independent with bed mobility and supervision for transfers. Mild unsteadiness with initial ambulation without AD. Continued ambulation for 175 feet with single hand held assist. Patient educated on using [facility] or walker as needed for safety and improved balance. She will continue to benefit from skilled PT to improve mobility and safety.         If plan is discharge home, recommend the following: A little help with walking and/or transfers;Help with stairs or ramp for entrance   Can travel by private vehicle    yes    Equipment Recommendations Rolling walker (2 wheels)  Recommendations for Other Services       Functional Status Assessment Patient has had a recent decline in their functional status and demonstrates the ability to make significant improvements in function in a reasonable and predictable amount of time.     Precautions / Restrictions Precautions Precautions: Fall Restrictions Weight Bearing Restrictions Per Provider Order: No      Mobility  Bed Mobility Overal bed mobility: Modified Independent Bed Mobility: Supine to Sit, Sit to Supine     Supine to sit: Modified independent (Device/Increase time), HOB elevated Sit to supine: Modified independent (Device/Increase time), HOB elevated        Transfers Overall transfer level: Needs  assistance Equipment used: None Transfers: Sit to/from Stand Sit to Stand: Supervision                Ambulation/Gait Ambulation/Gait assistance: Contact guard assist, Supervision Gait Distance (Feet): 175 Feet Assistive device: 1 person hand held assist Gait Pattern/deviations: Step-through pattern, Decreased step length - right, Decreased step length - left, Decreased stride length Gait velocity: decr     General Gait Details: patient initally unsteady, with increased distance balance improved. Educated patient in using cane or walker when returning home for safety  Stairs            Wheelchair Mobility     Tilt Bed    Modified Rankin (Stroke Patients Only)       Balance Overall balance assessment: Needs assistance Sitting-balance support: Feet supported Sitting balance-Leahy Scale: Normal Sitting balance - Comments: steady to doff/don socks at EOB   Standing balance support: Single extremity supported, During functional activity Standing balance-Leahy Scale: Fair Standing balance comment: SBA for mobility in room with slower pace and no AD use, lob initially with gait, reaching out for furniture in room                             Pertinent Vitals/Pain Pain Assessment Pain Descriptors / Indicators: Headache Pain Intervention(s): Monitored during session    Home Living Family/patient expects to be discharged to:: Private residence Living Arrangements: Spouse/significant other Available Help at Discharge: Family;Available 24 hours/day Type of Home: House Home Access: Level entry       Home Layout: Able to live on main level with bedroom/bathroom;Other (Comment) (  3 steps to laundry) Home Equipment: [place] - single point      Prior Function Prior Level of Function : Independent/Modified Independent;Driving;Working/employed             Mobility Comments: IND ADLs Comments: IND     Extremity/Trunk Assessment   Upper Extremity  Assessment Upper Extremity Assessment: Defer to OT evaluation    Lower Extremity Assessment Lower Extremity Assessment: RLE deficits/detail RLE Deficits / Details: R foot inversion noted with gait RLE Coordination: decreased gross motor    Cervical / Trunk Assessment Cervical / Trunk Assessment: Normal  Communication   Communication Communication: No apparent difficulties Cueing Techniques: Verbal cues  Cognition Arousal: Alert Behavior During Therapy: WFL for tasks assessed/performed Overall Cognitive Status: Within Functional Limits for tasks assessed                                 General Comments: alert and oriented x4; pleasant and cooperative        General Comments      Exercises     Assessment/Plan    PT Assessment Patient needs continued PT services  PT Problem List Decreased balance;Decreased knowledge of use of DME       PT Treatment Interventions DME instruction;Gait training;Stair training;Functional mobility training;Therapeutic activities;Balance training;Neuromuscular re-education;Patient/family education    PT Goals (Current goals can be found in the Care Plan section)  Acute Rehab PT Goals Patient Stated Goal: to improve, return home PT Goal Formulation: With patient Time For Goal Achievement: 02/25/23 Potential to Achieve Goals: Good    Frequency Min 1X/week     Co-evaluation               AM-PAC PT 6 Clicks Mobility  Outcome Measure Help needed turning from your back to your side while in a flat bed without using bedrails?: None Help needed moving from lying on your back to sitting on the side of a flat bed without using bedrails?: None Help needed moving to and from a bed to a chair (including a wheelchair)?: A Little Help needed standing up from a chair using your arms (e.g., wheelchair or bedside chair)?: None Help needed to walk in hospital room?: A Little Help needed climbing 3-5 steps with a railing? : A  Little 6 Click Score: 21    End of Session   Activity Tolerance: Patient tolerated treatment well Patient left: in bed;with call bell/phone within reach;with family/visitor present Nurse Communication: Mobility status PT Visit Diagnosis: Unsteadiness on feet (R26.81);Other abnormalities of gait and mobility (R26.89)    Time: 9072-9062 PT Time Calculation (min) (ACUTE ONLY): 10 min   Charges:   PT Evaluation $PT Eval Low Complexity: 1 Low   PT General Charges $$ ACUTE PT VISIT: 1 Visit         Raigen Jagielski, PT, GCS 02/18/23,9:46 AM

## 2023-02-18 NOTE — Assessment & Plan Note (Signed)
 Incidental finding on CTA chest/aorta: "A 9 mm splenic artery aneurysm. Recommend referral to or continued care with vascular specialist" --Follow up with Vascular Surgery

## 2023-02-18 NOTE — Care Management Obs Status (Deleted)
 MEDICARE OBSERVATION STATUS NOTIFICATION   Patient Details  Name: Alexandra Farmer MRN: [id number] Date of Birth: 1945-11-24   Medicare Observation Status Notification Given:  [place] Mode  [facility] 02/18/2023, 12:28 PM

## 2023-02-18 NOTE — Discharge Summary (Signed)
 Physician Discharge Summary   Patient: Alexandra Farmer MRN: [id number] DOB: 1945/04/21  Admit date:     02/17/2023  Discharge date: 02/18/23  Discharge Physician: Burnard DELENA Cunning   PCP: Don Lauraine Collar, NP   Recommendations at discharge:   Follow up with Primary Care  Repeat CBC, BMP at follow up Outpatient referral sent for ENT for possible vertigo. Please follow up on their recommendations. Started low dose Lipitor  in the setting of possible TIA and LDL > 100.  Monitor lipid profile. Recommend ongoing risk factor modifications for prevention of TIA/CVA Recommend outpatient evaluation with Vascular Surgery for surveillance of incidental finding of splenic artery aneurysm   Discharge Diagnoses: Principal Problem:   Suspected cerebrovascular accident (CVA) Active Problems:   Hypertension   Splenic artery aneurysm (HCC)   Pulmonary nodule  Resolved Problems:   * No resolved hospital problems. [facility] Course: HPI on admission 02/17/23: Alexandra Farmer is a 78 y.o. female with medical history significant of GERD, RLS presenting w/ suspected CVA. Pt reports waking up early this morning w/ dizziness, nausea. Also w/ gait instability.  Patient denies any prior episodes like this happening in the past.  Gait instability seem to be predominantly more on the right side with ambulation.  Mild vertiginous symptoms with the feeling of the room spinning.  No slurred speech or confusion.  Denies any known history of strokes in the past.  Mild chest pain.  No abdominal pain or diarrhea. Denies any tobacco or ETOH use.  Presented to ER afebrile, BP 150s/90s. Satting well on RA.  CBC and BMP within normal limits.  Troponin negative x 3.  EKG normal sinus rhythm.  CT head grossly stable.  CT angio of the chest and aorta are grossly stable.  Chest x-ray stable.   Pt was admitted for further evaluation and management. Stroke was ruled out on MRI brain.  Patient's symptoms improved with meclizine  and  she is ambulatory around the unit with PT without dizziness today.  She requests referral to ENT for evaluation.  Pt is medically stable and agreeable for discharge home today.   Assessment and Plan: * Suspected cerebrovascular accident (CVA) Stroke ruled out on MRI brain. Vertigo / BPPV in the differential given room spinning description and unstable gait which improved with meclizine . Stroke evaluation was unremarkable. --Referral to ENT submitted --Meclizine  PRN for recurrent symptoms --Low dose Lipitor  10 mg given LDL > 100  --Ongoing risk factor reduction recommended with PCP follow ups   Pulmonary nodule Incidental finding on CTA chest/aorta: 3 mm right lower lobe ground-glass nodule. No follow-up recommended. This recommendation follows the consensus statement: Guidelines for Management of Incidental Pulmonary Nodules Detected on CT Images: From the Fleischner Society 2017; Radiology 2017; 284:228-243  Splenic artery aneurysm [facility]) Incidental finding on CTA chest/aorta: A 9 mm splenic artery aneurysm. Recommend referral to or continued care with vascular specialist --Follow up with Vascular Surgery  Hypertension Initially permissive HTN until stroke ruled out.  Resume home regimen at discharge.         Consultants: none Procedures performed: none  Disposition: Home Diet recommendation:  Cardiac diet DISCHARGE MEDICATION: Allergies as of 02/18/2023       Reactions   Oxycodone Nausea And Vomiting   Sulfa Antibiotics Rash        Medication List     TAKE these medications    aspirin  81 MG tablet Take 81 mg by mouth daily.   atorvastatin  10 MG tablet Commonly known as:  Lipitor  Take 1 tablet (10 mg total) by mouth daily.   famotidine 40 MG tablet Commonly known as: PEPCID Take 40 mg by mouth every evening.   FISH OIL PO Take by mouth.   meclizine  25 MG tablet Commonly known as: ANTIVERT  Take 1 tablet (25 mg total) by mouth 3 (three) times  daily as needed for dizziness.   pantoprazole 20 MG tablet Commonly known as: PROTONIX Take 20 mg by mouth daily.   POLYETHYLENE GLYCOL 3350 PO Take 1 packet by mouth daily.   PROBIOTIC PO Take by mouth.        Discharge Exam: Filed Weights   02/17/23 0551  Weight: 81.6 kg   General exam: awake, alert, no acute distress HEENT: atraumatic, clear conjunctiva, anicteric sclera, moist mucus membranes, hearing grossly normal  Respiratory system: CTAB, no wheezes, rales or rhonchi, normal respiratory effort. Cardiovascular system: normal S1/S2, RRR, no JVD, murmurs, rubs, gallops, no pedal edema.   Gastrointestinal system: soft, NT, ND, no HSM felt, +bowel sounds. Central nervous system: A&O x 4. no gross focal neurologic deficits, normal speech Extremities: moves all , no edema, normal tone Skin: dry, intact, normal temperature, normal color, No rashes, lesions or ulcers Psychiatry: normal mood, congruent affect, judgement and insight appear normal   Condition at discharge: stable  The results of significant diagnostics from this hospitalization (including imaging, microbiology, ancillary and laboratory) are listed below for reference.   Imaging Studies: ECHOCARDIOGRAM COMPLETE Result Date: 02/18/2023    ECHOCARDIOGRAM REPORT   Patient Name:   Alexandra Farmer Date of Exam: 02/18/2023 Medical Rec #:  [id number]   Height:       63.0 in Accession #:    [id number]  Weight:       180.0 lb Date of Birth:  10/22/1945   BSA:          1.849 m Patient Age:    77 years    BP:           124/69 mmHg Patient Gender: F           HR:           66 bpm. Exam Location:  [facility] Procedure: 2D Echo, Cardiac Doppler and Color Doppler Indications:     TIA  History:         Patient has no prior history of Echocardiogram examinations.                  TIA; Risk Factors:Hypertension.  Sonographer:     Naomie Reef Referring Phys:  [phone] ELSPETH PARAS NEWTON Diagnosing Phys: Evalene Lunger MD IMPRESSIONS  1. Left  ventricular ejection fraction, by estimation, is 55 to 60%. The left ventricle has normal function. The left ventricle has no regional wall motion abnormalities. Left ventricular diastolic parameters are consistent with Grade I diastolic dysfunction (impaired relaxation).  2. Right ventricular systolic function is normal. The right ventricular size is normal. There is normal pulmonary artery systolic pressure. The estimated right ventricular systolic pressure is 28.2 mmHg.  3. The mitral valve is normal in structure. Mild mitral valve regurgitation. No evidence of mitral stenosis.  4. Tricuspid valve regurgitation is mild to moderate.  5. The aortic valve is normal in structure. Aortic valve regurgitation is not visualized. Aortic valve sclerosis is present, with no evidence of aortic valve stenosis.  6. The inferior vena cava is normal in size with greater than 50% respiratory variability, suggesting right atrial pressure of 3 mmHg. FINDINGS  Left Ventricle: Left  ventricular ejection fraction, by estimation, is 55 to 60%. The left ventricle has normal function. The left ventricle has no regional wall motion abnormalities. The left ventricular internal cavity size was normal in size. There is  no left ventricular hypertrophy. Left ventricular diastolic parameters are consistent with Grade I diastolic dysfunction (impaired relaxation). Right Ventricle: The right ventricular size is normal. No increase in right ventricular wall thickness. Right ventricular systolic function is normal. There is normal pulmonary artery systolic pressure. The tricuspid regurgitant velocity is 2.25 m/s, and  with an assumed right atrial pressure of 8 mmHg, the estimated right ventricular systolic pressure is 28.2 mmHg. Left Atrium: Left atrial size was normal in size. Right Atrium: Right atrial size was normal in size. Pericardium: There is no evidence of pericardial effusion. Mitral Valve: The mitral valve is normal in structure. Mild  mitral valve regurgitation. No evidence of mitral valve stenosis. MV peak gradient, 3.7 mmHg. The mean mitral valve gradient is 2.0 mmHg. Tricuspid Valve: The tricuspid valve is normal in structure. Tricuspid valve regurgitation is mild to moderate. No evidence of tricuspid stenosis. Aortic Valve: The aortic valve is normal in structure. Aortic valve regurgitation is not visualized. Aortic valve sclerosis is present, with no evidence of aortic valve stenosis. Aortic valve mean gradient measures 4.0 mmHg. Aortic valve peak gradient measures 6.6 mmHg. Aortic valve area, by VTI measures 2.90 cm. Pulmonic Valve: The pulmonic valve was normal in structure. Pulmonic valve regurgitation is not visualized. No evidence of pulmonic stenosis. Aorta: The aortic root is normal in size and structure. Venous: The inferior vena cava is normal in size with greater than 50% respiratory variability, suggesting right atrial pressure of 3 mmHg. IAS/Shunts: No atrial level shunt detected by color flow Doppler.  LEFT VENTRICLE PLAX 2D LVIDd:         4.60 cm     Diastology LVIDs:         2.80 cm     LV e' medial:    6.42 cm/s LV PW:         1.00 cm     LV E/e' medial:  10.7 LV IVS:        0.90 cm     LV e' lateral:   7.62 cm/s LVOT diam:     2.00 cm     LV E/e' lateral: 9.1 LV SV:         79 LV SV Index:   43 LVOT Area:     3.14 cm  LV Volumes (MOD) LV vol d, MOD A2C: 28.9 ml LV vol d, MOD A4C: 71.4 ml LV vol s, MOD A2C: 12.5 ml LV vol s, MOD A4C: 22.2 ml LV SV MOD A2C:     16.4 ml LV SV MOD A4C:     71.4 ml LV SV MOD BP:      34.7 ml RIGHT VENTRICLE RV Basal diam:  4.10 cm RV Mid diam:    4.20 cm RV S prime:     22.40 cm/s TAPSE (M-mode): 2.8 cm LEFT ATRIUM             Index        RIGHT ATRIUM           Index LA diam:        3.70 cm 2.00 cm/m   RA Area:     16.70 cm LA Vol (A2C):   46.1 ml 24.93 ml/m  RA Volume:   44.90 ml  24.28 ml/m LA Vol (A4C):  42.5 ml 22.99 ml/m LA Biplane Vol: 47.7 ml 25.80 ml/m  AORTIC VALVE                     PULMONIC VALVE AV Area (Vmax):    2.65 cm     PV Vmax:       0.85 m/s AV Area (Vmean):   2.60 cm     PV Peak grad:  2.9 mmHg AV Area (VTI):     2.90 cm AV Vmax:           128.00 cm/s AV Vmean:          88.000 cm/s AV VTI:            0.272 m AV Peak Grad:      6.6 mmHg AV Mean Grad:      4.0 mmHg LVOT Vmax:         108.00 cm/s LVOT Vmean:        72.900 cm/s LVOT VTI:          0.251 m LVOT/AV VTI ratio: 0.92  AORTA Ao Root diam: 3.00 cm MITRAL VALVE               TRICUSPID VALVE MV Area (PHT): 3.54 cm    TR Peak grad:   20.2 mmHg MV Area VTI:   3.20 cm    TR Vmax:        225.00 cm/s MV Peak grad:  3.7 mmHg MV Mean grad:  2.0 mmHg    SHUNTS MV Vmax:       0.97 m/s    Systemic VTI:  0.25 m MV Vmean:      58.3 cm/s   Systemic Diam: 2.00 cm MV Decel Time: 214 msec MV E velocity: 69.00 cm/s MV A velocity: 80.00 cm/s MV E/A ratio:  0.86 Evalene Lunger MD Electronically signed by Evalene Lunger MD Signature Date/Time: 02/18/2023/12:56:33 PM    Final    MR BRAIN WO CONTRAST Result Date: 02/17/2023 CLINICAL DATA:  Stroke suspected EXAM: MRI HEAD WITHOUT CONTRAST TECHNIQUE: Multiplanar, multiecho pulse sequences of the brain and surrounding structures were obtained without intravenous contrast. COMPARISON:  No prior MRI available, correlation is made with 02/17/2023 CT head FINDINGS: Brain: No restricted diffusion to suggest acute or subacute infarct. No acute hemorrhage, mass, mass effect, or midline shift. No hydrocephalus or extra-axial collection. Pituitary and craniocervical junction within normal limits. No hemosiderin deposition to suggest remote hemorrhage. Scattered T2 hyperintense signal in the periventricular white matter, likely the sequela of chronic small vessel ischemic disease. Vascular: Normal arterial flow voids. Skull and upper cervical spine: Normal marrow signal. Sinuses/Orbits: Clear paranasal sinuses. No acute finding in the orbits. Other: The mastoid air cells are well aerated. IMPRESSION: No  acute intracranial process. No evidence of acute or subacute infarct. Electronically Signed   By: Donald Campion M.D.   On: 02/17/2023 15:21   CT ANGIO HEAD NECK W WO CM Result Date: 02/17/2023 CLINICAL DATA:  Dizziness, TIA suspected EXAM: CT ANGIOGRAPHY HEAD AND NECK WITH AND WITHOUT CONTRAST TECHNIQUE: Multidetector CT imaging of the head and neck was performed using the standard protocol during bolus administration of intravenous contrast. Multiplanar CT image reconstructions and MIPs were obtained to evaluate the vascular anatomy. Carotid stenosis measurements (when applicable) are obtained utilizing NASCET criteria, using the distal internal carotid diameter as the denominator. RADIATION DOSE REDUCTION: This exam was performed according to the departmental dose-optimization program which includes automated exposure control, adjustment of the mA  and/or kV according to patient size and/or use of iterative reconstruction technique. CONTRAST:  75mL OMNIPAQUE  IOHEXOL  350 MG/ML SOLN COMPARISON:  02/17/2023 CT head and MRI head, no prior CTA available FINDINGS: CT HEAD FINDINGS For noncontrast findings, please see same day CT head. CTA NECK FINDINGS Aortic arch: Four-vessel arch, with the left vertebral artery originating from the aorta. Imaged portion shows no evidence of aneurysm or dissection. No significant stenosis of the major arch vessel origins. Right carotid system: No evidence of dissection, occlusion, or hemodynamically significant stenosis (greater than 50%). Retropharyngeal course of the proximal right ICA. Left carotid system: No evidence of dissection, occlusion, or hemodynamically significant stenosis (greater than 50%). Vertebral arteries: Right dominant system. No evidence of dissection, occlusion, or hemodynamically significant stenosis (greater than 50%). Skeleton: No acute osseous abnormality. Degenerative changes in the cervical spine. Other neck: No acute finding. Upper chest: No focal  pulmonary opacity or pleural effusion. Review of the MIP images confirms the above findings CTA HEAD FINDINGS Anterior circulation: Both internal carotid arteries are patent to the termini, without significant stenosis. A1 segments patent. Normal anterior communicating artery. Anterior cerebral arteries are patent to their distal aspects without significant stenosis. No M1 stenosis or occlusion. MCA branches perfused to their distal aspects without significant stenosis. Posterior circulation: Vertebral arteries patent to the vertebrobasilar junction without significant stenosis. Posterior inferior cerebellar arteries patent proximally. Basilar patent to its distal aspect without significant stenosis. Superior cerebellar arteries patent proximally. Diminutive, patent P1 segments. Near fetal origin of the bilateral PCAs from the posterior communicating arteries. PCAs perfused to their distal aspects without significant stenosis. Venous sinuses: As permitted by contrast timing, patent. Anatomic variants: Near fetal origin of the bilateral PCAs. No evidence of aneurysm or vascular malformation. Review of the MIP images confirms the above findings IMPRESSION: 1. No intracranial large vessel occlusion or significant stenosis. 2. No hemodynamically significant stenosis in the neck. Electronically Signed   By: Donald Campion M.D.   On: 02/17/2023 15:15   CT Angio Chest Aorta W and/or Wo Contrast Result Date: 02/17/2023 CLINICAL DATA:  Acute onset dizziness, nausea, and vomiting upon waking with hypertension and chest pain EXAM: CT ANGIOGRAPHY CHEST WITH CONTRAST TECHNIQUE: Noncontrast CT of the chest was initially obtained. Multidetector CT imaging of the chest was performed using the standard protocol during bolus administration of intravenous contrast. Multiplanar CT image reconstructions and MIPs were obtained to evaluate the vascular anatomy. RADIATION DOSE REDUCTION: This exam was performed according to the  departmental dose-optimization program which includes automated exposure control, adjustment of the mA and/or kV according to patient size and/or use of iterative reconstruction technique. CONTRAST:  75mL OMNIPAQUE  IOHEXOL  350 MG/ML SOLN COMPARISON:  Same day chest radiograph, CTA chest dated 07/09/2004 FINDINGS: Cardiovascular: Preferential opacification of the thoracic aorta. No evidence of thoracic aortic aneurysm or dissection. Normal heart size. No pericardial effusion. Left vertebral artery arises directly from the aorta. No central pulmonary emboli. Mediastinum/Nodes: Imaged thyroid gland without nodules meeting criteria for imaging follow-up by size. Small hiatal hernia. No pathologically enlarged axillary, supraclavicular, mediastinal, or hilar lymph nodes. Lungs/Pleura: The central airways are patent. 3 mm right lower lobe ground-glass nodule (8:106). No focal consolidation. No pneumothorax. No pleural effusion. Upper abdomen: Diffuse hepatic parenchymal hypoattenuation can be seen with hepatic steatosis. Splenic artery aneurysm measures 9 x 8 mm (10:76). Musculoskeletal: No acute or abnormal lytic or blastic osseous lesions. Right humeral bone anchor. Multilevel degenerative changes of the thoracic spine. Review of the MIP images confirms the  above findings. IMPRESSION: 1. No evidence of thoracic aortic aneurysm or dissection. No central pulmonary emboli. 2. No acute intrathoracic abnormality. 3. A 3 mm right lower lobe ground-glass nodule. No follow-up recommended. This recommendation follows the consensus statement: Guidelines for Management of Incidental Pulmonary Nodules Detected on CT Images: From the Fleischner Society 2017; Radiology 2017; 284:228-243. 4. A 9 mm splenic artery aneurysm. Recommend referral to or continued care with vascular specialist. (Ref.: J Vasc Surg. 2018; 67:2-77 and J Am Coll Radiol 2013;10(10):789-794.) Electronically Signed   By: Limin  Xu M.D.   On: 02/17/2023 09:29   CT  Head Wo Contrast Result Date: 02/17/2023 CLINICAL DATA:  Headache, new onset. Dizziness. Nausea and vomiting. EXAM: CT HEAD WITHOUT CONTRAST TECHNIQUE: Contiguous axial images were obtained from the base of the skull through the vertex without intravenous contrast. RADIATION DOSE REDUCTION: This exam was performed according to the departmental dose-optimization program which includes automated exposure control, adjustment of the mA and/or kV according to patient size and/or use of iterative reconstruction technique. COMPARISON:  None Available. FINDINGS: Brain: Mild age related volume loss. No evidence of old or acute focal infarction, mass lesion, hemorrhage, hydrocephalus or extra-axial collection. Vascular: No abnormal vascular finding. Skull: Normal Sinuses/Orbits: Clear/normal Other: None IMPRESSION: No acute or reversible finding. Mild age related volume loss. Electronically Signed   By: Oneil Officer M.D.   On: 02/17/2023 09:10   DG Chest 2 View Result Date: 02/17/2023 CLINICAL DATA:  78 year old female with chest pain and dizziness. Hypertensive. EXAM: CHEST - 2 VIEW COMPARISON:  Portable chest and Chest CT 07/09/2004. FINDINGS: Seated AP and lateral views of the chest at 0616 hours. Mediastinal contours remain within normal limits. Normal lung volumes. Visualized tracheal air column is within normal limits. No pneumothorax, pulmonary edema, pleural effusion or confluent lung opacity. No acute osseous abnormality identified. Small bone anchor in the proximal right humerus. Negative visible bowel gas. IMPRESSION: No acute cardiopulmonary abnormality. Electronically Signed   By: VEAR Hurst M.D.   On: 02/17/2023 07:55   MM 3D SCREENING MAMMOGRAM BILATERAL BREAST Result Date: 02/11/2023 CLINICAL DATA:  Screening. EXAM: DIGITAL SCREENING BILATERAL MAMMOGRAM WITH TOMOSYNTHESIS AND CAD TECHNIQUE: Bilateral screening digital craniocaudal and mediolateral oblique mammograms were obtained. Bilateral screening digital  breast tomosynthesis was performed. The images were evaluated with computer-aided detection. COMPARISON:  Previous exam(s). ACR Breast Density Category a: The breasts are almost entirely fatty. FINDINGS: There are no findings suspicious for malignancy. IMPRESSION: No mammographic evidence of malignancy. A result letter of this screening mammogram will be mailed directly to the patient. RECOMMENDATION: Screening mammogram in one year. (Code:[id number]-B-01Y) BI-RADS CATEGORY  1: Negative. Electronically Signed   By: Alm Parkins M.D.   On: 02/11/2023 07:34    Microbiology: No results found for this or any previous visit.  Labs: CBC: Recent Labs  Lab 02/17/23 0553  WBC 6.1  HGB 13.6  HCT 39.8  MCV 86.9  PLT 241   Basic Metabolic Panel: Recent Labs  Lab 02/17/23 0553  NA 145  K 3.6  CL 107  CO2 23  GLUCOSE 102*  BUN 19  CREATININE 0.64  CALCIUM  9.0   Liver Function Tests: No results for input(s): AST, ALT, ALKPHOS, BILITOT, PROT, ALBUMIN in the last 168 hours. CBG: No results for input(s): GLUCAP in the last 168 hours.  Discharge time spent: greater than 30 minutes.  Signed: Burnard DELENA Cunning, DO [facility] 02/19/2023

## 2023-02-18 NOTE — Progress Notes (Signed)
   02/18/23 0900  Spiritual Encounters  Type of Visit Initial  Care provided to: Pt and family  Referral source Nurse (RN/NT/LPN)  Reason for visit Advance directives  OnCall Visit Yes   Advance Directive paperwork given and explained to patient and to family present. Patient knows to call Chaplain services when completed.

## 2023-02-18 NOTE — TOC Progression Note (Signed)
 Transition of Care [facility]) - Progression Note    Patient Details  Name: Alexandra Farmer MRN: [id number] Date of Birth: [birth year]  [facility]) CM/SW Contact  Jamilex Bohnsack A Timisha Mondry, RN Phone Number: 02/18/2023, 2:16 PM  Clinical Narrative:    Chart reviewed.  Noted that patient was admitted with suspected CVA.    I have spoken with [name] about PT recommendation for Outpatient PT.  [name] has declined Outpatient PT at this time.  She would like to follow up with her PCP if she feels like she needs it on discharge.  [name] was agreeable to a rolling walker on discharge.    I have asked Thom with [employer] to provide patient with a rolling walker.    No other TOC needs identified.         Expected Discharge Plan and Services                                               Social Determinants of Health (SDOH) Interventions SDOH Screenings   Food Insecurity: No Food Insecurity (02/17/2023)  Housing: Low Risk  (02/17/2023)  Transportation Needs: No Transportation Needs (02/17/2023)  Utilities: Not At Risk (02/17/2023)  Financial Resource Strain: Medium Risk (11/24/2022)   Received from [facility]  Social Connections: Unknown (02/17/2023)  Tobacco Use: Low Risk  (02/17/2023)    Readmission Risk Interventions     No data to display

## 2023-02-18 NOTE — TOC Transition Note (Signed)
 Transition of Care [facility]) - Discharge Note   Patient Details  Name: Alexandra Farmer MRN: [id number] Date of Birth: 10/20/1945  Transition of Care [facility]) CM/SW Contact:  Carrson Lightcap A Alvan Culpepper, RN Phone Number: 02/18/2023, 2:54 PM   Clinical Narrative:    Patient has declined Outpatient PT services at this time.  Patient reports that she will follow up with PCP if she feels like she needs Outpatient PT.   I have asked Thom with [employer] to provide patient a rolling walker at bedside today.    I have informed staff nurse of the above information.     Final next level of care:  (Patient has declined Outpatient PT.) Barriers to Discharge: No Barriers Identified   Patient Goals and CMS Choice            Discharge Placement                    Patient and family notified of of transfer: 02/18/23  Discharge Plan and Services Additional resources added to the After Visit Summary for                  DME Arranged: Walker rolling DME Agency: [employer]                  Social Drivers of Health (SDOH) Interventions SDOH Screenings   Food Insecurity: No Food Insecurity (02/17/2023)  Housing: Low Risk  (02/17/2023)  Transportation Needs: No Transportation Needs (02/17/2023)  Utilities: Not At Risk (02/17/2023)  Financial Resource Strain: Medium Risk (11/24/2022)   Received from [facility]  Social Connections: Unknown (02/17/2023)  Tobacco Use: Low Risk  (02/17/2023)     Readmission Risk Interventions     No data to display

## 2023-02-18 NOTE — Progress Notes (Signed)
*  PRELIMINARY RESULTS* Echocardiogram 2D Echocardiogram has been performed.  Alexandra Farmer 02/18/2023, 12:30 PM

## 2023-02-18 NOTE — Assessment & Plan Note (Addendum)
 Incidental finding on CTA chest/aorta: 3 mm right lower lobe ground-glass nodule. No follow-up recommended. This recommendation follows the consensus statement: Guidelines for Management of Incidental Pulmonary Nodules Detected on CT Images: From the Fleischner Society 2017; Radiology 2017; [phone]

## 2023-02-18 NOTE — Care Management Obs Status (Signed)
 MEDICARE OBSERVATION STATUS NOTIFICATION   Patient Details  Name: CORTEZ STEELMAN MRN: [id number] Date of Birth: 05-Sep-1945   Medicare Observation Status Notification Given:  Yes    Sherilyn Banker 02/18/2023, 12:30 PM

## 2023-03-02 DIAGNOSIS — Z09 Encounter for follow-up examination after completed treatment for conditions other than malignant neoplasm: Secondary | ICD-10-CM | POA: Diagnosis not present

## 2023-03-02 DIAGNOSIS — I10 Essential (primary) hypertension: Secondary | ICD-10-CM | POA: Diagnosis not present

## 2023-03-02 DIAGNOSIS — Z79899 Other long term (current) drug therapy: Secondary | ICD-10-CM | POA: Diagnosis not present

## 2023-03-09 DIAGNOSIS — H401121 Primary open-angle glaucoma, left eye, mild stage: Secondary | ICD-10-CM | POA: Diagnosis not present

## 2023-03-09 DIAGNOSIS — H2512 Age-related nuclear cataract, left eye: Secondary | ICD-10-CM | POA: Diagnosis not present

## 2023-03-26 DIAGNOSIS — R42 Dizziness and giddiness: Secondary | ICD-10-CM | POA: Diagnosis not present

## 2023-03-30 ENCOUNTER — Encounter (INDEPENDENT_AMBULATORY_CARE_PROVIDER_SITE_OTHER): Payer: Self-pay | Admitting: Nurse Practitioner

## 2023-03-30 ENCOUNTER — Ambulatory Visit (INDEPENDENT_AMBULATORY_CARE_PROVIDER_SITE_OTHER): Payer: Medicare PPO | Admitting: Nurse Practitioner

## 2023-03-30 VITALS — BP 137/89 | HR 71 | Resp 18 | Ht 63.0 in | Wt 186.6 lb

## 2023-03-30 DIAGNOSIS — I10 Essential (primary) hypertension: Secondary | ICD-10-CM

## 2023-03-30 DIAGNOSIS — I728 Aneurysm of other specified arteries: Secondary | ICD-10-CM | POA: Diagnosis not present

## 2023-03-30 NOTE — Progress Notes (Unsigned)
 Subjective:    Patient ID: Alexandra Farmer, adult    DOB: 19-Mar-1945, 78 y.o.   MRN: [id number] Chief Complaint  Patient presents with  . New Patient (Initial Visit)    np. consult. CT 02/17/23. 9 mm splenic artery aneurysm. NWG:[id number],YQMVH    HPI  Review of Systems     Objective:   Physical Exam  BP 137/89   Pulse 71   Resp 18   Ht 5\' 3"  (1.6 m)   Wt 186 lb 9.6 oz (84.6 kg)   BMI 33.05 kg/m   Past Medical History:  Diagnosis Date  . GERD (gastroesophageal reflux disease)   . Restless leg syndrome   . Wears dentures    full upper and lower    Social History   Socioeconomic History  . Marital status: Married    Spouse name: Not on file  . Number of children: Not on file  . Years of education: Not on file  . Highest education level: Not on file  Occupational History  . Not on file  Tobacco Use  . Smoking status: Never  . Smokeless tobacco: Never  Vaping Use  . Vaping status: Never Used  Substance and Sexual Activity  . Alcohol use: No  . Drug use: No  . Sexual activity: Not on file  Other Topics Concern  . Not on file  Social History Narrative  . Not on file   Social Drivers of Health   Financial Resource Strain: Medium Risk (11/24/2022)   Received from [facility]   Overall Financial Resource Strain (CARDIA)   . Difficulty of Paying Living Expenses: Somewhat hard  Food Insecurity: No Food Insecurity (02/17/2023)   Hunger Vital Sign   . Worried About Programme researcher, broadcasting/film/video in the Last Year: Never true   . Ran Out of Food in the Last Year: Never true  Transportation Needs: No Transportation Needs (02/17/2023)   PRAPARE - Transportation   . Lack of Transportation (Medical): No   . Lack of Transportation (Non-Medical): No  Physical Activity: Not on file  Stress: Not on file  Social Connections: Unknown (02/17/2023)   Social Connection and Isolation Panel [NHANES]   . Frequency of Communication with Friends and Family: More than three times a  week   . Frequency of Social Gatherings with Friends and Family: Three times a week   . Attends Religious Services: 1 to 4 times per year   . Active Member of Clubs or Organizations: Patient declined   . Attends Banker Meetings: Patient declined   . Marital Status: Married  Catering manager Violence: Not At Risk (02/17/2023)   Humiliation, Afraid, Rape, and Kick questionnaire   . Fear of Current or Ex-Partner: No   . Emotionally Abused: No   . Physically Abused: No   . Sexually Abused: No    Past Surgical History:  Procedure Laterality Date  . ABDOMINAL HYSTERECTOMY  02/10/1984  . CATARACT EXTRACTION W/PHACO Right 12/10/2014   Procedure: CATARACT EXTRACTION PHACO AND INTRAOCULAR LENS PLACEMENT (IOC);  Surgeon: Sherald Hess, MD;  Location: [facility];  Service: Ophthalmology;  Laterality: Right;  . COLONOSCOPY N/A 03/05/2015   Procedure: COLONOSCOPY;  Surgeon: Wallace Cullens, MD;  Location: [facility];  Service: Gastroenterology;  Laterality: N/A;  . COLONOSCOPY WITH PROPOFOL N/A 06/01/2022   Procedure: COLONOSCOPY WITH PROPOFOL;  Surgeon: Regis Bill, MD;  Location: [facility];  Service: Endoscopy;  Laterality: N/A;  . ESOPHAGOGASTRODUODENOSCOPY N/A 03/05/2015  Procedure: ESOPHAGOGASTRODUODENOSCOPY (EGD);  Surgeon: Wallace Cullens, MD;  Location: [facility];  Service: Gastroenterology;  Laterality: N/A;  . EYE SURGERY    . RETINAL DETACHMENT SURGERY Right 02/09/2013  . ROTATOR CUFF REPAIR  02/10/2007    Family History  Problem Relation Age of Onset  . Breast cancer Neg Hx     Allergies  Allergen Reactions  . Oxycodone Nausea And Vomiting  . Sulfa Antibiotics Rash       Latest Ref Rng & Units 02/17/2023    5:53 AM  CBC  WBC 4.0 - 10.5 K/uL 6.1   Hemoglobin 12.0 - 15.0 g/dL 16.1   Hematocrit 09.6 - 46.0 % 39.8   Platelets 150 - 400 K/uL 241       CMP     Component Value Date/Time   NA 145 02/17/2023 0553   K 3.6  02/17/2023 0553   CL 107 02/17/2023 0553   CO2 23 02/17/2023 0553   GLUCOSE 102 (H) 02/17/2023 0553   BUN 19 02/17/2023 0553   CREATININE 0.64 02/17/2023 0553   CALCIUM 9.0 02/17/2023 0553   GFRNONAA >60 02/17/2023 0553     No results found.     Assessment & Plan:   1. Splenic artery aneurysm (HCC) (Primary) No surgery or intervention at this time.  The patient has an asymptomatic splenic artery aneurysm that is less than 2.5 cm in maximal diameter.  I have discussed the natural history of splenic artery aneurysms and the small risk of rupture for aneurysm less than 2.5 cm in size.  However, as these small aneurysms tend to enlarge over time, continued surveillance with ultrasound or CT scan is mandatory.   The increased risk of rupture during pregnancy was also discussed.  I have also discussed optimizing medical management of co-morbidities such as  hypertension and lipid control as well as the importance of abstinence from tobacco if the patient is a current smoker.  The patient is also encouraged to exercise a minimum of 30 minutes 4 times a week.   Should the patient develop new onset abdominal or back pain they are instructed to seek medical attention immediately and to alert the physician providing care that they have an aneurysm.   The patient voices their understanding.  The patient will return in 5 months   2. Primary hypertension Continue antihypertensive medications as already ordered, these medications have been reviewed and there are no changes at this time.   Current Outpatient Medications on File Prior to Visit  Medication Sig Dispense Refill  . aspirin 81 MG tablet Take 81 mg by mouth daily.    Marland Kitchen atorvastatin (LIPITOR) 10 MG tablet Take 1 tablet (10 mg total) by mouth daily. 30 tablet 2  . famotidine (PEPCID) 40 MG tablet Take 40 mg by mouth every evening.    . fluticasone (FLONASE) 50 MCG/ACT nasal spray Place into the nose.    . latanoprost (XALATAN) 0.005  % ophthalmic solution Apply to eye.    . Multiple Vitamin (MULTI-VITAMIN) tablet Take 1 tablet by mouth daily.    . Omega-3 Fatty Acids (FISH OIL PO) Take by mouth.    . pantoprazole (PROTONIX) 20 MG tablet Take 20 mg by mouth daily.    Marland Kitchen POLYETHYLENE GLYCOL 3350 PO Take 1 packet by mouth daily.    . pramipexole (MIRAPEX) 0.25 MG tablet TAKE 1 TABLET BY MOUTH AT BEDTIME 30-60 MINS PRIOR TO BEDTIME    . Probiotic Product (PROBIOTIC PO) Take by mouth.    Marland Kitchen  RABEprazole (ACIPHEX) 20 MG tablet Take 20 mg by mouth daily.    . meclizine (ANTIVERT) 25 MG tablet Take 1 tablet (25 mg total) by mouth 3 (three) times daily as needed for dizziness. (Patient not taking: Reported on 03/30/2023) 30 tablet 0   No current facility-administered medications on file prior to visit.    There are no Patient Instructions on file for this visit. No follow-ups on file.   Georgiana Spinner, NP

## 2023-04-19 ENCOUNTER — Institutional Professional Consult (permissible substitution) (INDEPENDENT_AMBULATORY_CARE_PROVIDER_SITE_OTHER): Payer: Medicare PPO | Admitting: Otolaryngology

## 2023-04-23 DIAGNOSIS — E669 Obesity, unspecified: Secondary | ICD-10-CM | POA: Diagnosis not present

## 2023-04-23 DIAGNOSIS — K219 Gastro-esophageal reflux disease without esophagitis: Secondary | ICD-10-CM | POA: Diagnosis not present

## 2023-04-23 DIAGNOSIS — I10 Essential (primary) hypertension: Secondary | ICD-10-CM | POA: Diagnosis not present

## 2023-04-23 DIAGNOSIS — M1990 Unspecified osteoarthritis, unspecified site: Secondary | ICD-10-CM | POA: Diagnosis not present

## 2023-04-23 DIAGNOSIS — E785 Hyperlipidemia, unspecified: Secondary | ICD-10-CM | POA: Diagnosis not present

## 2023-04-23 DIAGNOSIS — Z8249 Family history of ischemic heart disease and other diseases of the circulatory system: Secondary | ICD-10-CM | POA: Diagnosis not present

## 2023-04-23 DIAGNOSIS — Z833 Family history of diabetes mellitus: Secondary | ICD-10-CM | POA: Diagnosis not present

## 2023-04-23 DIAGNOSIS — H409 Unspecified glaucoma: Secondary | ICD-10-CM | POA: Diagnosis not present

## 2023-04-23 DIAGNOSIS — M810 Age-related osteoporosis without current pathological fracture: Secondary | ICD-10-CM | POA: Diagnosis not present

## 2023-05-27 DIAGNOSIS — R7302 Impaired glucose tolerance (oral): Secondary | ICD-10-CM | POA: Diagnosis not present

## 2023-05-27 DIAGNOSIS — G2581 Restless legs syndrome: Secondary | ICD-10-CM | POA: Diagnosis not present

## 2023-05-27 DIAGNOSIS — N3946 Mixed incontinence: Secondary | ICD-10-CM | POA: Diagnosis not present

## 2023-05-27 DIAGNOSIS — Z79899 Other long term (current) drug therapy: Secondary | ICD-10-CM | POA: Diagnosis not present

## 2023-05-27 DIAGNOSIS — I728 Aneurysm of other specified arteries: Secondary | ICD-10-CM | POA: Diagnosis not present

## 2023-05-27 DIAGNOSIS — E7800 Pure hypercholesterolemia, unspecified: Secondary | ICD-10-CM | POA: Diagnosis not present

## 2023-05-27 DIAGNOSIS — L989 Disorder of the skin and subcutaneous tissue, unspecified: Secondary | ICD-10-CM | POA: Diagnosis not present

## 2023-05-27 DIAGNOSIS — K5909 Other constipation: Secondary | ICD-10-CM | POA: Diagnosis not present

## 2023-05-27 DIAGNOSIS — K219 Gastro-esophageal reflux disease without esophagitis: Secondary | ICD-10-CM | POA: Diagnosis not present

## 2023-06-23 DIAGNOSIS — L817 Pigmented purpuric dermatosis: Secondary | ICD-10-CM | POA: Diagnosis not present

## 2023-06-23 DIAGNOSIS — L814 Other melanin hyperpigmentation: Secondary | ICD-10-CM | POA: Diagnosis not present

## 2023-06-23 DIAGNOSIS — D225 Melanocytic nevi of trunk: Secondary | ICD-10-CM | POA: Diagnosis not present

## 2023-06-23 DIAGNOSIS — L821 Other seborrheic keratosis: Secondary | ICD-10-CM | POA: Diagnosis not present

## 2023-06-23 DIAGNOSIS — L918 Other hypertrophic disorders of the skin: Secondary | ICD-10-CM | POA: Diagnosis not present

## 2023-06-23 DIAGNOSIS — L738 Other specified follicular disorders: Secondary | ICD-10-CM | POA: Diagnosis not present

## 2023-06-23 DIAGNOSIS — L218 Other seborrheic dermatitis: Secondary | ICD-10-CM | POA: Diagnosis not present

## 2023-06-23 DIAGNOSIS — L980 Pyogenic granuloma: Secondary | ICD-10-CM | POA: Diagnosis not present

## 2023-06-23 DIAGNOSIS — D485 Neoplasm of uncertain behavior of skin: Secondary | ICD-10-CM | POA: Diagnosis not present

## 2023-06-23 DIAGNOSIS — L815 Leukoderma, not elsewhere classified: Secondary | ICD-10-CM | POA: Diagnosis not present

## 2023-09-01 DIAGNOSIS — Z0100 Encounter for examination of eyes and vision without abnormal findings: Secondary | ICD-10-CM | POA: Diagnosis not present

## 2023-09-01 DIAGNOSIS — H401131 Primary open-angle glaucoma, bilateral, mild stage: Secondary | ICD-10-CM | POA: Diagnosis not present

## 2023-09-01 DIAGNOSIS — H2512 Age-related nuclear cataract, left eye: Secondary | ICD-10-CM | POA: Diagnosis not present

## 2023-09-01 DIAGNOSIS — Z961 Presence of intraocular lens: Secondary | ICD-10-CM | POA: Diagnosis not present

## 2023-09-24 ENCOUNTER — Other Ambulatory Visit (INDEPENDENT_AMBULATORY_CARE_PROVIDER_SITE_OTHER): Payer: Self-pay | Admitting: Nurse Practitioner

## 2023-09-24 DIAGNOSIS — I728 Aneurysm of other specified arteries: Secondary | ICD-10-CM

## 2023-09-27 ENCOUNTER — Ambulatory Visit (INDEPENDENT_AMBULATORY_CARE_PROVIDER_SITE_OTHER): Payer: Medicare PPO

## 2023-09-27 ENCOUNTER — Ambulatory Visit (INDEPENDENT_AMBULATORY_CARE_PROVIDER_SITE_OTHER): Payer: Medicare PPO | Admitting: Nurse Practitioner

## 2023-09-27 ENCOUNTER — Encounter (INDEPENDENT_AMBULATORY_CARE_PROVIDER_SITE_OTHER): Payer: Self-pay | Admitting: Nurse Practitioner

## 2023-09-27 VITALS — BP 145/87 | HR 61 | Ht 63.0 in | Wt 188.4 lb

## 2023-09-27 DIAGNOSIS — I10 Essential (primary) hypertension: Secondary | ICD-10-CM

## 2023-09-27 DIAGNOSIS — I8311 Varicose veins of right lower extremity with inflammation: Secondary | ICD-10-CM

## 2023-09-27 DIAGNOSIS — I8312 Varicose veins of left lower extremity with inflammation: Secondary | ICD-10-CM | POA: Diagnosis not present

## 2023-09-27 DIAGNOSIS — I728 Aneurysm of other specified arteries: Secondary | ICD-10-CM

## 2023-09-27 NOTE — Progress Notes (Signed)
 Subjective:    Patient ID: Alexandra Farmer List, female    DOB: [birth year], 78 y.o.   MRN: [id number] Chief Complaint  Patient presents with   Follow-up     fu 6 month + Mesenteric see JD or FB     [name] is a 78 year old female who returns today after a 0.9 cm CT splenic artery aneurysm was found incidentally on CT scan.  She denies any abdominal pain.  She denies any pain that radiates from her abdomen to her back.  Initially in the hospital her blood pressure was very elevated but it has been under good control since that time.  Currently she has no claudication or rest pain like symptoms.  Today her studies indicate a splenic artery diameter 0.84 cm.  Ultrasound notes that there is no focal dilatation or diameter greater than 1 cm.  They believe that the CT diameters are likely result of severe vessel tortuosity versus aneurysmal disease.  Normal celiac artery and mesenteric artery findings.  The patient also notes that she has restless leg syndrome.  She has concern that this is related to her possible varicose veins.  She notes that she has multiple varicosities that have recently become in to become tender and uncomfortable for her.    Review of Systems  Gastrointestinal:  Negative for abdominal pain.  All other systems reviewed and are negative.      Objective:   Physical Exam Vitals reviewed.  HENT:     Head: Normocephalic.  Cardiovascular:     Rate and Rhythm: Normal rate.     Pulses: Normal pulses.  Pulmonary:     Effort: Pulmonary effort is normal.  Musculoskeletal:     Right lower leg: No edema.     Left lower leg: No edema.  Skin:    General: Skin is warm and dry.  Neurological:     Mental Status: She is alert and oriented to person, place, and time.  Psychiatric:        Mood and Affect: Mood normal.        Behavior: Behavior normal.        Thought Content: Thought content normal.        Judgment: Judgment normal.     BP (!) 145/87   Pulse 61   Ht 5' 3 (1.6  m)   Wt 188 lb 6.4 oz (85.5 kg)   BMI 33.37 kg/m   Past Medical History:  Diagnosis Date   GERD (gastroesophageal reflux disease)    Restless leg syndrome    Wears dentures    full upper and lower    Social History   Socioeconomic History   Marital status: Married    Spouse name: Not on file   Number of children: Not on file   Years of education: Not on file   Highest education level: Not on file  Occupational History   Not on file  Tobacco Use   Smoking status: Never   Smokeless tobacco: Never  Vaping Use   Vaping status: Never Used  Substance and Sexual Activity   Alcohol use: No   Drug use: No   Sexual activity: Not on file  Other Topics Concern   Not on file  Social History Narrative   Not on file   Social Drivers of Health   Financial Resource Strain: Medium Risk (11/24/2022)   Received from [facility]   Overall Financial Resource Strain (CARDIA)    Difficulty of Paying Living Expenses:  Somewhat hard  Food Insecurity: No Food Insecurity (02/17/2023)   Hunger Vital Sign    Worried About Running Out of Food in the Last Year: Never true    Ran Out of Food in the Last Year: Never true  Transportation Needs: No Transportation Needs (02/17/2023)   PRAPARE - Administrator, Civil Service (Medical): No    Lack of Transportation (Non-Medical): No  Physical Activity: Not on file  Stress: Not on file  Social Connections: Unknown (02/17/2023)   Social Connection and Isolation Panel    Frequency of Communication with Friends and Family: More than three times a week    Frequency of Social Gatherings with Friends and Family: Three times a week    Attends Religious Services: 1 to 4 times per year    Active Member of Clubs or Organizations: Patient declined    Attends Banker Meetings: Patient declined    Marital Status: Married  Catering manager Violence: Not At Risk (02/17/2023)   Humiliation, Afraid, Rape, and Kick questionnaire     Fear of Current or Ex-Partner: No    Emotionally Abused: No    Physically Abused: No    Sexually Abused: No    Past Surgical History:  Procedure Laterality Date   ABDOMINAL HYSTERECTOMY  02/10/1984   CATARACT EXTRACTION W/PHACO Right 12/10/2014   Procedure: CATARACT EXTRACTION PHACO AND INTRAOCULAR LENS PLACEMENT (IOC);  Surgeon: Donzell Arlyce Budd, MD;  Location: [facility];  Service: Ophthalmology;  Laterality: Right;   COLONOSCOPY N/A 03/05/2015   Procedure: COLONOSCOPY;  Surgeon: Deward CINDERELLA Piedmont, MD;  Location: [facility];  Service: Gastroenterology;  Laterality: N/A;   COLONOSCOPY WITH PROPOFOL  N/A 06/01/2022   Procedure: COLONOSCOPY WITH PROPOFOL ;  Surgeon: Maryruth Ole DASEN, MD;  Location: [facility];  Service: Endoscopy;  Laterality: N/A;   ESOPHAGOGASTRODUODENOSCOPY N/A 03/05/2015   Procedure: ESOPHAGOGASTRODUODENOSCOPY (EGD);  Surgeon: Deward CINDERELLA Piedmont, MD;  Location: [facility];  Service: Gastroenterology;  Laterality: N/A;   EYE SURGERY     RETINAL DETACHMENT SURGERY Right 02/09/2013   ROTATOR CUFF REPAIR  02/10/2007    Family History  Problem Relation Age of Onset   Breast cancer Neg Hx     Allergies  Allergen Reactions   Oxycodone Nausea And Vomiting   Sulfa Antibiotics Rash       Latest Ref Rng & Units 02/17/2023    5:53 AM  CBC  WBC 4.0 - 10.5 K/uL 6.1   Hemoglobin 12.0 - 15.0 g/dL 86.3   Hematocrit 63.9 - 46.0 % 39.8   Platelets 150 - 400 K/uL 241       CMP     Component Value Date/Time   NA 145 02/17/2023 0553   K 3.6 02/17/2023 0553   CL 107 02/17/2023 0553   CO2 23 02/17/2023 0553   GLUCOSE 102 (H) 02/17/2023 0553   BUN 19 02/17/2023 0553   CREATININE 0.64 02/17/2023 0553   CALCIUM  9.0 02/17/2023 0553   GFRNONAA >60 02/17/2023 0553     No results found.     Assessment & Plan:   1. Splenic artery aneurysm (HCC) (Primary) No surgery or intervention at this time.  Ultrasound today suggest that the area is  more so a result of marked tortuosity versus aneurysm however an abundance of caution we will continue with monitoring.  The patient has an asymptomatic splenic artery aneurysm that is less than 2.5 cm in maximal diameter.  I have discussed the natural history of splenic artery  aneurysms and the small risk of rupture for aneurysm less than 2.5 cm in size.  However, as these small aneurysms tend to enlarge over time, continued surveillance with ultrasound or CT scan is mandatory.   The increased risk of rupture during pregnancy was also discussed.  I have also discussed optimizing medical management of co-morbidities such as  hypertension and lipid control as well as the importance of abstinence from tobacco if the patient is a current smoker.  The patient is also encouraged to exercise a minimum of 30 minutes 4 times a week.   Should the patient develop new onset abdominal or back pain they are instructed to seek medical attention immediately and to alert the physician providing care that they have an aneurysm.   The patient voices their understanding.  The patient will return in 5 months   2. Primary hypertension Continue antihypertensive medications as already ordered, these medications have been reviewed and there are no changes at this time.    3. Varicose veins of both lower extremities with inflammation I discussed with the patient that varicose veins are not a cause of restless leg syndrome however in some instances significant inflammation can worsen restless leg syndrome.  Based on that and her notable varicosities bilaterally we will have her return at her convenience for bilateral venous reflux study to evaluate her veins and also to mention.  We discussed conservative treatment options including use of medical grade compression socks, elevation and activity.   Current Outpatient Medications on File Prior to Visit  Medication Sig Dispense Refill   aspirin  81 MG tablet Take 81 mg  by mouth daily.     atorvastatin  (LIPITOR ) 10 MG tablet Take 1 tablet (10 mg total) by mouth daily. 30 tablet 2   famotidine (PEPCID) 40 MG tablet Take 40 mg by mouth every evening.     fluticasone (FLONASE) 50 MCG/ACT nasal spray Place into the nose.     latanoprost (XALATAN) 0.005 % ophthalmic solution Apply to eye.     meclizine  (ANTIVERT ) 25 MG tablet Take 1 tablet (25 mg total) by mouth 3 (three) times daily as needed for dizziness. 30 tablet 0   Multiple Vitamin (MULTI-VITAMIN) tablet Take 1 tablet by mouth daily.     Omega-3 Fatty Acids (FISH OIL PO) Take by mouth.     pantoprazole (PROTONIX) 20 MG tablet Take 20 mg by mouth daily.     POLYETHYLENE GLYCOL 3350 PO Take 1 packet by mouth daily.     pramipexole  (MIRAPEX ) 0.25 MG tablet TAKE 1 TABLET BY MOUTH AT BEDTIME 30-60 MINS PRIOR TO BEDTIME     Probiotic Product (PROBIOTIC PO) Take by mouth.     RABEprazole (ACIPHEX) 20 MG tablet Take 20 mg by mouth daily.     No current facility-administered medications on file prior to visit.    There are no Patient Instructions on file for this visit. No follow-ups on file.   Krimson Massmann E Jacobi Ryant, NP

## 2023-10-20 ENCOUNTER — Emergency Department

## 2023-10-20 ENCOUNTER — Emergency Department
Admission: EM | Admit: 2023-10-20 | Discharge: 2023-10-20 | Disposition: A | Discharge status: Home / Self Care | Attending: Emergency Medicine | Admitting: Emergency Medicine

## 2023-10-20 ENCOUNTER — Other Ambulatory Visit: Payer: Self-pay

## 2023-10-20 DIAGNOSIS — I10 Essential (primary) hypertension: Secondary | ICD-10-CM | POA: Insufficient documentation

## 2023-10-20 DIAGNOSIS — Z79899 Other long term (current) drug therapy: Secondary | ICD-10-CM | POA: Diagnosis not present

## 2023-10-20 DIAGNOSIS — R079 Chest pain, unspecified: Secondary | ICD-10-CM | POA: Diagnosis not present

## 2023-10-20 LAB — TROPONIN I (HIGH SENSITIVITY): Troponin I (High Sensitivity): 5 ng/L (ref <18)

## 2023-10-20 LAB — BASIC METABOLIC PANEL WITH GFR
Anion gap: 10 (ref 5–15)
BUN: 18 mg/dL (ref 8–23)
CO2: 25 mmol/L (ref 22–32)
Calcium: 9.3 mg/dL (ref 8.9–10.3)
Chloride: 107 mmol/L (ref 98–111)
Creatinine, Ser: 0.47 mg/dL (ref 0.44–1.00)
GFR, Estimated: >60 mL/min (ref >60)
Glucose, Bld: 103 mg/dL — ABNORMAL HIGH (ref 70–99)
Potassium: 3.9 mmol/L (ref 3.5–5.1)
Sodium: 142 mmol/L (ref 135–145)

## 2023-10-20 LAB — CBC
HCT: 41.1 % (ref 36.0–46.0)
Hemoglobin: 14 g/dL (ref 12.0–15.0)
MCH: 29.2 pg (ref 26.0–34.0)
MCHC: 34.1 g/dL (ref 30.0–36.0)
MCV: 85.6 fL (ref 80.0–100.0)
Platelets: 265 K/uL (ref 150–400)
RBC: 4.8 MIL/uL (ref 3.87–5.11)
RDW: 12.9 % (ref 11.5–15.5)
WBC: 5.8 K/uL (ref 4.0–10.5)
nRBC: 0 % (ref 0.0–0.2)

## 2023-10-20 MED ORDER — AMLODIPINE BESYLATE 5 MG PO TABS: 5.0000 mg | ORAL_TABLET | Freq: Every day | ORAL | 11 refills | Status: AC

## 2023-10-20 NOTE — ED Triage Notes (Addendum)
 Pt arrives via POV with c/o high BP. Pt states that pressure is normally in the 150's but it's been running in the 207/101 range at home. Pt is taking statin but nothing for BP. Pt is A&Ox4 and ambulatory in triage.   Pt stated that she's been having some CP that is different from her normal that is up her neck and in her back, but it's like her normal indigestion pain but her normal remedies haven't worked today.

## 2023-10-20 NOTE — ED Provider Notes (Signed)
 [facility] Provider Note    Event Date/Time   First MD Initiated Contact with Patient 10/20/23 1735     (approximate)   History   Hypertension   HPI  Alexandra Farmer is a 78 y.o. female with a history of hypercholesterolemia who takes atorvastatin  presents with complaints of high blood pressure, she has been monitoring her blood pressure and has noted to be over 200 systolic several times in the last several days.  She decided to come today because she is concerned.  Blood pressure on triage 152/93, she is asymptomatic at this time, no headache or chest pain     Physical Exam   Triage Vital Signs: ED Triage Vitals  Encounter Vitals Group     BP 10/20/23 1540 (!) 152/93     Girls Systolic BP Percentile --      Girls Diastolic BP Percentile --      Boys Systolic BP Percentile --      Boys Diastolic BP Percentile --      Pulse Rate 10/20/23 1540 75     Resp 10/20/23 1540 17     Temp 10/20/23 1540 98.1 F (36.7 C)     Temp Source 10/20/23 1540 Oral     SpO2 10/20/23 1540 98 %     Weight 10/20/23 1543 87.1 kg (192 lb)     Height 10/20/23 1543 1.6 m (5' 3)     Head Circumference --      Peak Flow --      Pain Score 10/20/23 1553 0     Pain Loc --      Pain Education --      Exclude from Growth Chart --     Most recent vital signs: Vitals:   10/20/23 1540  BP: (!) 152/93  Pulse: 75  Resp: 17  Temp: 98.1 F (36.7 C)  SpO2: 98%     General: Awake, no distress.  CV:  Good peripheral perfusion.  Regular rate and rhythm Resp:  Normal effort.  Clear to auscultation bilaterally Abd:  No distention.  Other:     ED Results / Procedures / Treatments   Labs (all labs ordered are listed, but only abnormal results are displayed) Labs Reviewed  BASIC METABOLIC PANEL WITH GFR - Abnormal; Notable for the following components:      Result Value   Glucose, Bld 103 (*)    All other components within normal limits  CBC  TROPONIN I (HIGH  SENSITIVITY)     EKG  ED ECG REPORT I, Lamar Price, the attending physician, personally viewed and interpreted this ECG.  Date: 10/20/2023  Rhythm: normal sinus rhythm QRS Axis: normal Intervals: normal ST/T Wave abnormalities: normal Narrative Interpretation: no evidence of acute ischemia    RADIOLOGY Chest x-ray view interpret by me, no acute abnormality, confirmed by radiology    PROCEDURES:  Critical Care performed:   Procedures   MEDICATIONS ORDERED IN ED: Medications - No data to display   IMPRESSION / MDM / ASSESSMENT AND PLAN / ED COURSE  I reviewed the triage vital signs and the nursing notes. Patient's presentation is most consistent with exacerbation of chronic illness.  Patient presents with elevated blood pressure, upon reviewing her medical records she has had elevated blood pressures x 2 over the last several months, typically only mildly elevated prior to that had been relatively well-controlled.  Blood pressure here is 152/93, she is asymptomatic, lab work reviewed and is reassuring,  Start her  on low-dose amlodipine  given multiple elevated blood pressures, she will follow-up closely with her PCP        FINAL CLINICAL IMPRESSION(S) / ED DIAGNOSES   Final diagnoses:  Primary hypertension     Rx / DC Orders   ED Discharge Orders          Ordered    amLODipine  (NORVASC ) 5 MG tablet  Daily        10/20/23 1740             Note:  This document was prepared using Dragon voice recognition software and may include unintentional dictation errors.   Arlander Charleston, MD 10/20/23 [phone]

## 2023-11-05 ENCOUNTER — Other Ambulatory Visit (INDEPENDENT_AMBULATORY_CARE_PROVIDER_SITE_OTHER): Payer: Self-pay | Admitting: Nurse Practitioner

## 2023-11-05 DIAGNOSIS — I8311 Varicose veins of right lower extremity with inflammation: Secondary | ICD-10-CM

## 2023-11-08 ENCOUNTER — Encounter (INDEPENDENT_AMBULATORY_CARE_PROVIDER_SITE_OTHER): Payer: Self-pay | Admitting: Nurse Practitioner

## 2023-11-08 ENCOUNTER — Ambulatory Visit (INDEPENDENT_AMBULATORY_CARE_PROVIDER_SITE_OTHER): Admitting: Nurse Practitioner

## 2023-11-08 ENCOUNTER — Ambulatory Visit (INDEPENDENT_AMBULATORY_CARE_PROVIDER_SITE_OTHER)

## 2023-11-08 VITALS — Resp 17 | Ht 63.0 in | Wt 183.0 lb

## 2023-11-08 DIAGNOSIS — I8311 Varicose veins of right lower extremity with inflammation: Secondary | ICD-10-CM | POA: Diagnosis not present

## 2023-11-08 DIAGNOSIS — I728 Aneurysm of other specified arteries: Secondary | ICD-10-CM | POA: Diagnosis not present

## 2023-11-08 DIAGNOSIS — I10 Essential (primary) hypertension: Secondary | ICD-10-CM | POA: Diagnosis not present

## 2023-11-08 DIAGNOSIS — I8312 Varicose veins of left lower extremity with inflammation: Secondary | ICD-10-CM

## 2023-11-08 NOTE — Progress Notes (Signed)
 Subjective:    Patient ID: Ronal GORMAN List, female    DOB: 1945-02-23, 78 y.o.   MRN: [id number] Chief Complaint  Patient presents with   Follow-up    pt conv Bilat Venouse Reflux  Swelling both ankles feeling of tightness     Patient returns today for evaluation of swelling and varicose veins.  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  However she notes that her left swelling primarily in the left lower extremity.  She is having veins for a number of years but recently they have become more tired and heavy and achy.  Prior to her retirement she wear compression socks consistently but has not worn them as much since then.  She also tries to elevate her lower extremities.  Today she has noninvasive studies which showed no evidence of DVT or superficial arthritis bilaterally.  There is evidence of deep venous insufficiency as well as superficial venous reflux in the left lower extremity.  None is noted in the right.    Review of Systems  Cardiovascular:  Positive for leg swelling.  All other systems reviewed and are negative.      Objective:   Physical Exam Vitals reviewed.  HENT:     Head: Normocephalic.  Cardiovascular:     Rate and Rhythm: Normal rate.  Pulmonary:     Effort: Pulmonary effort is normal.  Skin:    General: Skin is warm and dry.  Neurological:     Mental Status: She is alert and oriented to person, place, and time.  Psychiatric:        Mood and Affect: Mood normal.        Behavior: Behavior normal.        Thought Content: Thought content normal.        Judgment: Judgment normal.     Resp 17   Ht 5' 3 (1.6 m)   Wt 183 lb (83 kg)   BMI 32.42 kg/m   Past Medical History:  Diagnosis Date   GERD (gastroesophageal reflux disease)    Restless leg syndrome    Wears dentures    full upper and lower    Social History   Socioeconomic History   Marital status: Married    Spouse name: Not on file   Number of children: Not on  file   Years of education: Not on file   Highest education level: Not on file  Occupational History   Not on file  Tobacco Use   Smoking status: Never   Smokeless tobacco: Never  Vaping Use   Vaping status: Never Used  Substance and Sexual Activity   Alcohol use: No   Drug use: No   Sexual activity: Not on file  Other Topics Concern   Not on file  Social History Narrative   Not on file   Social Drivers of Health   Financial Resource Strain: Medium Risk (11/24/2022)   Received from [facility]   Overall Financial Resource Strain (CARDIA)    Difficulty of Paying Living Expenses: Somewhat hard  Food Insecurity: No Food Insecurity (02/17/2023)   Hunger Vital Sign    Worried About Running Out of Food in the Last Year: Never true    Ran Out of Food in the Last Year: Never true  Transportation Needs: No Transportation Needs (02/17/2023)   PRAPARE - Administrator, Civil Service (Medical): No    Lack of Transportation (Non-Medical): No  Physical Activity:  Not on file  Stress: Not on file  Social Connections: Unknown (02/17/2023)   Social Connection and Isolation Panel    Frequency of Communication with Friends and Family: More than three times a week    Frequency of Social Gatherings with Friends and Family: Three times a week    Attends Religious Services: 1 to 4 times per year    Active Member of Clubs or Organizations: Patient declined    Attends Banker Meetings: Patient declined    Marital Status: Married  Catering manager Violence: Not At Risk (02/17/2023)   Humiliation, Afraid, Rape, and Kick questionnaire    Fear of Current or Ex-Partner: No    Emotionally Abused: No    Physically Abused: No    Sexually Abused: No    Past Surgical History:  Procedure Laterality Date   ABDOMINAL HYSTERECTOMY  02/10/1984   CATARACT EXTRACTION W/PHACO Right 12/10/2014   Procedure: CATARACT EXTRACTION PHACO AND INTRAOCULAR LENS PLACEMENT (IOC);   Surgeon: Donzell Arlyce Budd, MD;  Location: [facility];  Service: Ophthalmology;  Laterality: Right;   COLONOSCOPY N/A 03/05/2015   Procedure: COLONOSCOPY;  Surgeon: Deward CINDERELLA Piedmont, MD;  [facility];  Service: Gastroenterology;  Laterality: N/A;   COLONOSCOPY WITH PROPOFOL  N/A 06/01/2022   Procedure: COLONOSCOPY WITH PROPOFOL ;  Surgeon: Maryruth Ole DASEN, MD;  Location: [facility];  Service: Endoscopy;  Laterality: N/A;   ESOPHAGOGASTRODUODENOSCOPY N/A 03/05/2015   Procedure: ESOPHAGOGASTRODUODENOSCOPY (EGD);  Surgeon: Deward CINDERELLA Piedmont, MD;  Location: [facility];  Service: Gastroenterology;  Laterality: N/A;   EYE SURGERY     RETINAL DETACHMENT SURGERY Right 02/09/2013   ROTATOR CUFF REPAIR  02/10/2007    Family History  Problem Relation Age of Onset   Breast cancer Neg Hx     Allergies  Allergen Reactions   Oxycodone Nausea And Vomiting   Sulfa Antibiotics Rash       Latest Ref Rng & Units 10/20/2023    3:57 PM 02/17/2023    5:53 AM  CBC  WBC 4.0 - 10.5 K/uL 5.8  6.1   Hemoglobin 12.0 - 15.0 g/dL 85.9  86.3   Hematocrit 36.0 - 46.0 % 41.1  39.8   Platelets 150 - 400 K/uL 265  241       CMP     Component Value Date/Time   NA 142 10/20/2023 1557   K 3.9 10/20/2023 1557   CL 107 10/20/2023 1557   CO2 25 10/20/2023 1557   GLUCOSE 103 (H) 10/20/2023 1557   BUN 18 10/20/2023 1557   CREATININE 0.47 10/20/2023 1557   CALCIUM  9.3 10/20/2023 1557   GFRNONAA >60 10/20/2023 1557     No results found.     Assessment & Plan:   1. 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  We discussed treatment options including conservative therapy as well as endovenous laser ablation.  Following this discussion the patient will engage in conservative therapy.  She is advised to utilize medical grade  compression stockings.  She is also advised to elevate her lower extremities when not active as well as walking.  Patient is advised utilize 20 to 30 mmHg for maximum benefit.  Will plan to have her return in 3 months to evaluate progress.  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  Will continue with annual  follow-up.  3. Primary hypertension Continue antihypertensive medications as already ordered, these medications have been reviewed and there are no changes at this time.   Current Outpatient Medications on File Prior to Visit  Medication Sig Dispense Refill   amLODipine  (NORVASC ) 5 MG tablet Take 1 tablet (5 mg total) by mouth daily. 30 tablet 11   aspirin  81 MG tablet Take 81 mg by mouth daily.     atorvastatin  (LIPITOR ) 10 MG tablet Take 1 tablet (10 mg total) by mouth daily. 30 tablet 2   famotidine (PEPCID) 40 MG tablet Take 40 mg by mouth every evening.     fluticasone (FLONASE) 50 MCG/ACT nasal spray Place into the nose.     latanoprost (XALATAN) 0.005 % ophthalmic solution Apply to eye.     meclizine  (ANTIVERT ) 25 MG tablet Take 1 tablet (25 mg total) by mouth 3 (three) times daily as needed for dizziness. 30 tablet 0   Multiple Vitamin (MULTI-VITAMIN) tablet Take 1 tablet by mouth daily.     Omega-3 Fatty Acids (FISH OIL PO) Take by mouth.     pantoprazole (PROTONIX) 20 MG tablet Take 20 mg by mouth daily.     POLYETHYLENE GLYCOL 3350 PO Take 1 packet by mouth daily.     pramipexole  (MIRAPEX ) 0.25 MG tablet TAKE 1 TABLET BY MOUTH AT BEDTIME 30-60 MINS PRIOR TO BEDTIME     Probiotic Product (PROBIOTIC PO) Take by mouth.     RABEprazole (ACIPHEX) 20 MG tablet Take 20 mg by mouth daily.     No current facility-administered medications on file prior to visit.    There are no Patient Instructions on file for this visit. No follow-ups on file.   Brant Peets E Ricco Dershem, NP

## 2023-12-09 ENCOUNTER — Other Ambulatory Visit: Payer: Self-pay | Admitting: Nurse Practitioner

## 2023-12-09 DIAGNOSIS — Z0000 Encounter for general adult medical examination without abnormal findings: Secondary | ICD-10-CM | POA: Diagnosis not present

## 2023-12-09 DIAGNOSIS — Z1231 Encounter for screening mammogram for malignant neoplasm of breast: Secondary | ICD-10-CM

## 2023-12-09 DIAGNOSIS — E7800 Pure hypercholesterolemia, unspecified: Secondary | ICD-10-CM | POA: Diagnosis not present

## 2023-12-09 DIAGNOSIS — Z23 Encounter for immunization: Secondary | ICD-10-CM | POA: Diagnosis not present

## 2023-12-09 DIAGNOSIS — I10 Essential (primary) hypertension: Secondary | ICD-10-CM | POA: Diagnosis not present

## 2023-12-09 DIAGNOSIS — E66811 Obesity, class 1: Secondary | ICD-10-CM | POA: Diagnosis not present

## 2023-12-09 DIAGNOSIS — Z79899 Other long term (current) drug therapy: Secondary | ICD-10-CM | POA: Diagnosis not present

## 2023-12-09 DIAGNOSIS — K219 Gastro-esophageal reflux disease without esophagitis: Secondary | ICD-10-CM | POA: Diagnosis not present

## 2023-12-09 DIAGNOSIS — G2581 Restless legs syndrome: Secondary | ICD-10-CM | POA: Diagnosis not present

## 2023-12-09 DIAGNOSIS — R7302 Impaired glucose tolerance (oral): Secondary | ICD-10-CM | POA: Diagnosis not present

## 2024-02-07 ENCOUNTER — Ambulatory Visit (INDEPENDENT_AMBULATORY_CARE_PROVIDER_SITE_OTHER): Admitting: Nurse Practitioner

## 2024-02-07 ENCOUNTER — Encounter (INDEPENDENT_AMBULATORY_CARE_PROVIDER_SITE_OTHER): Payer: Self-pay | Admitting: Nurse Practitioner

## 2024-02-07 VITALS — BP 120/81 | HR 65 | Resp 18 | Wt 190.8 lb

## 2024-02-07 DIAGNOSIS — I728 Aneurysm of other specified arteries: Secondary | ICD-10-CM | POA: Diagnosis not present

## 2024-02-07 DIAGNOSIS — I8311 Varicose veins of right lower extremity with inflammation: Secondary | ICD-10-CM

## 2024-02-07 DIAGNOSIS — I10 Essential (primary) hypertension: Secondary | ICD-10-CM | POA: Diagnosis not present

## 2024-02-07 DIAGNOSIS — I8312 Varicose veins of left lower extremity with inflammation: Secondary | ICD-10-CM

## 2024-02-07 NOTE — Progress Notes (Signed)
 "  Subjective:    Patient ID: Alexandra Farmer List, female    DOB: Jun 13, 1945, 77 y.o.   MRN: [id number] Chief Complaint  Patient presents with   Follow-up    3 month follow up    HPI  Discussed the use of AI scribe software for clinical note transcription with the patient, who gave verbal consent to proceed.  History of Present Illness Alexandra Farmer is a 78 year old female with chronic venous insufficiency who presents for follow-up of lower extremity swelling and vascular imaging findings.  She has chronic lower extremity edema, predominantly affecting the left leg, with mild swelling also present in the right leg. The swelling is persistent but not severe, with some increase noted in the morning. She uses knee-high compression stockings daily, which effectively reduce the severity of swelling. She previously wore support hose and pantyhose year-round but now prefers lighter compression. She denies significant pain, large varicosities, or substantial discomfort associated with the edema.  Venous duplex ultrasound demonstrated no evidence of deep vein thrombosis but revealed venous reflux involving both the deep venous system and the saphenofemoral junction of the left leg, consistent with valvular insufficiency.  She has a splenic artery vascular abnormality, initially identified as an aneurysm on CT scan. Subsequent ultrasound imaging has been equivocal, with uncertainty as to whether the lesion represents a true aneurysm or arterial tortuosity. The lesion has remained stable in size over at least six months of interval imaging, and she remains asymptomatic.    Results Radiology Splenic artery CT: Aneurysm versus arterial tortuosity; increased calcification; size stable compared to ultrasound  Diagnostic Left lower extremity venous duplex ultrasound: No deep vein thrombosis; venous reflux in deep venous system and at saphenofemoral junction; valvular insufficiency Splenic artery ultrasound:  Equivocal for aneurysm versus arterial tortuosity; no significant change compared to prior study; not enlarged to threshold for intervention   Review of Systems  Cardiovascular:  Positive for leg swelling.  Gastrointestinal:  Negative for abdominal pain.  All other systems reviewed and are negative.      Objective:   Physical Exam Vitals reviewed.  HENT:     Head: Normocephalic.  Cardiovascular:     Rate and Rhythm: Normal rate.     Pulses: Normal pulses.  Pulmonary:     Effort: Pulmonary effort is normal.  Musculoskeletal:     Left lower leg: Edema present.  Skin:    General: Skin is warm.  Neurological:     Mental Status: She is alert and oriented to person, place, and time.  Psychiatric:        Mood and Affect: Mood normal.        Behavior: Behavior normal.        Thought Content: Thought content normal.        Judgment: Judgment normal.     Physical Exam    BP 120/81 (BP Location: Left Arm)   Pulse 65   Resp 18   Wt 190 lb 12.8 oz (86.5 kg)   BMI 33.80 kg/m   Past Medical History:  Diagnosis Date   GERD (gastroesophageal reflux disease)    Restless leg syndrome    Wears dentures    full upper and lower    Social History   Socioeconomic History   Marital status: Married    Spouse name: Not on file   Number of children: Not on file   Years of education: Not on file   Highest education level: Not on file  Occupational History  Not on file  Tobacco Use   Smoking status: Never   Smokeless tobacco: Never  Vaping Use   Vaping status: Never Used  Substance and Sexual Activity   Alcohol use: No   Drug use: No   Sexual activity: Not on file  Other Topics Concern   Not on file  Social History Narrative   Not on file   Social Drivers of Health   Tobacco Use: Low Risk (02/07/2024)   Patient History    Smoking Tobacco Use: Never    Smokeless Tobacco Use: Never    Passive Exposure: Not on file  Financial Resource Strain: Medium Risk  (12/09/2023)   Received from [facility]   Overall Financial Resource Strain (CARDIA)    Difficulty of Paying Living Expenses: Somewhat hard  Food Insecurity: No Food Insecurity (12/09/2023)   Received from [facility]   Epic    Within the past 12 months, you worried that your food would run out before you got the money to buy more.: Never true    Within the past 12 months, the food you bought just didn't last and you didn't have money to get more.: Never true  Transportation Needs: No Transportation Needs (12/09/2023)   Received from [facility] - Transportation    In the past 12 months, has lack of transportation kept you from medical appointments or from getting medications?: No    Lack of Transportation (Non-Medical): No  Physical Activity: Not on file  Stress: Not on file  Social Connections: Unknown (02/17/2023)   Social Connection and Isolation Panel    Frequency of Communication with Friends and Family: More than three times a week    Frequency of Social Gatherings with Friends and Family: Three times a week    Attends Religious Services: 1 to 4 times per year    Active Member of Clubs or Organizations: Patient declined    Attends Banker Meetings: Patient declined    Marital Status: Married  Catering Manager Violence: Not At Risk (02/17/2023)   Humiliation, Afraid, Rape, and Kick questionnaire    Fear of Current or Ex-Partner: No    Emotionally Abused: No    Physically Abused: No    Sexually Abused: No  Depression (PHQ2-9): Not on file  Alcohol Screen: Not on file  Housing: Low Risk  (12/09/2023)   Received from [facility]   Epic    In the last 12 months, was there a time when you were not able to pay the mortgage or rent on time?: No    In the past 12 months, how many times have you moved where you were living?: 0    At any time in the past 12 months, were you homeless or living  in a shelter (including now)?: No  Utilities: Not At Risk (12/09/2023)   Received from [facility]   Epic    In the past 12 months has the electric, gas, oil, or water  company threatened to shut off services in your home?: No  Health Literacy: Not on file    Past Surgical History:  Procedure Laterality Date   ABDOMINAL HYSTERECTOMY  02/10/1984   CATARACT EXTRACTION W/PHACO Right 12/10/2014   Procedure: CATARACT EXTRACTION PHACO AND INTRAOCULAR LENS PLACEMENT (IOC);  Surgeon: Donzell Arlyce Budd, MD;  Location: [facility];  Service: Ophthalmology;  Laterality: Right;   COLONOSCOPY N/A 03/05/2015   Procedure: COLONOSCOPY;  Surgeon: Deward  CINDERELLA Piedmont, MD;  Location: [facility];  Service: Gastroenterology;  Laterality: N/A;   COLONOSCOPY WITH PROPOFOL  N/A 06/01/2022   Procedure: COLONOSCOPY WITH PROPOFOL ;  Surgeon: Maryruth Ole DASEN, MD;  Location: [facility];  Service: Endoscopy;  Laterality: N/A;   ESOPHAGOGASTRODUODENOSCOPY N/A 03/05/2015   Procedure: ESOPHAGOGASTRODUODENOSCOPY (EGD);  Surgeon: Deward CINDERELLA Piedmont, MD;  Location: [facility];  Service: Gastroenterology;  Laterality: N/A;   EYE SURGERY     RETINAL DETACHMENT SURGERY Right 02/09/2013   ROTATOR CUFF REPAIR  02/10/2007    Family History  Problem Relation Age of Onset   Breast cancer Neg Hx     Allergies[1]     Latest Ref Rng & Units 10/20/2023    3:57 PM 02/17/2023    5:53 AM  CBC  WBC 4.0 - 10.5 K/uL 5.8  6.1   Hemoglobin 12.0 - 15.0 g/dL 85.9  86.3   Hematocrit 36.0 - 46.0 % 41.1  39.8   Platelets 150 - 400 K/uL 265  241       CMP     Component Value Date/Time   NA 142 10/20/2023 1557   K 3.9 10/20/2023 1557   CL 107 10/20/2023 1557   CO2 25 10/20/2023 1557   GLUCOSE 103 (H) 10/20/2023 1557   BUN 18 10/20/2023 1557   CREATININE 0.47 10/20/2023 1557   CALCIUM  9.3 10/20/2023 1557   GFRNONAA >60 10/20/2023 1557     No results found.     Assessment & Plan:   1.  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 Symptoms mild, managed with compression stockings. Endovenous laser ablation considered but deferred due to limited benefit from deep venous insufficiency. - Continue compression stockings for symptom control. - Advise leg elevation at rest and regular ambulation. - Defer endovenous laser ablation due to limited benefit. - Follow-up in August to reassess symptoms, especially during warmer months. - Engaged in shared decision making; she elected to continue conservative management.  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 Annual ultrasound surveillance appropriate. Repeat CT deferred due to stability and contrast risks. - Schedule annual surveillance ultrasound in August. - Defer repeat CT due to lesion stability and contrast risks. - Continue non-invasive monitoring with annual ultrasound. - Engaged in shared decision making; she agreed with conservative monitoring.  3. Primary hypertension Continue antihypertensive medications as already ordered, these medications have been reviewed and there are no changes at this time.   Current Outpatient Medications on File Prior to Visit  Medication Sig Dispense Refill   amLODipine  (NORVASC ) 5 MG tablet Take 1 tablet (5 mg total) by mouth daily. 30 tablet 11   aspirin  81 MG tablet Take 81 mg by mouth daily.     atorvastatin  (LIPITOR ) 10 MG tablet Take 1 tablet (10 mg total) by mouth daily. 30 tablet 2   famotidine (PEPCID) 40 MG tablet Take 40 mg by mouth every evening.     fluticasone (FLONASE) 50 MCG/ACT nasal spray Place into the nose.     latanoprost (XALATAN) 0.005 % ophthalmic solution Apply to eye.     meclizine   (ANTIVERT ) 25 MG tablet Take 1 tablet (25 mg total) by mouth 3 (three) times daily as needed for dizziness. 30 tablet 0   Multiple Vitamin (MULTI-VITAMIN) tablet Take 1 tablet by mouth daily.  Omega-3 Fatty Acids (FISH OIL PO) Take by mouth.     pantoprazole (PROTONIX) 20 MG tablet Take 20 mg by mouth daily.     POLYETHYLENE GLYCOL 3350 PO Take 1 packet by mouth daily.     pramipexole  (MIRAPEX ) 0.25 MG tablet TAKE 1 TABLET BY MOUTH AT BEDTIME 30-60 MINS PRIOR TO BEDTIME     Probiotic Product (PROBIOTIC PO) Take by mouth.     RABEprazole (ACIPHEX) 20 MG tablet Take 20 mg by mouth daily.     No current facility-administered medications on file prior to visit.    There are no Patient Instructions on file for this visit. No follow-ups on file.   Aadil Sur E Courtany Mcmurphy, NP       [1]  Allergies Allergen Reactions   Oxycodone Nausea And Vomiting   Sulfa Antibiotics Rash   "

## 2024-02-14 ENCOUNTER — Ambulatory Visit
Admission: RE | Admit: 2024-02-14 | Discharge: 2024-02-14 | Disposition: A | Discharge status: Home / Self Care | Source: Ambulatory Visit | Attending: Nurse Practitioner | Admitting: Nurse Practitioner

## 2024-02-14 DIAGNOSIS — Z1231 Encounter for screening mammogram for malignant neoplasm of breast: Secondary | ICD-10-CM | POA: Diagnosis present

## 2024-09-25 ENCOUNTER — Ambulatory Visit (INDEPENDENT_AMBULATORY_CARE_PROVIDER_SITE_OTHER): Admitting: Nurse Practitioner

## 2024-09-25 ENCOUNTER — Encounter (INDEPENDENT_AMBULATORY_CARE_PROVIDER_SITE_OTHER)
